# Patient Record
Sex: Male | Born: 1970 | Race: White | Hispanic: No | Marital: Single | Smoking: Current every day smoker
Health system: Southern US, Community
[De-identification: ages and names within clinical notes are randomized; demographics above are authoritative.]

## PROBLEM LIST (undated history)

## (undated) DIAGNOSIS — I10 Essential (primary) hypertension: Secondary | ICD-10-CM

## (undated) DIAGNOSIS — E7800 Pure hypercholesterolemia, unspecified: Secondary | ICD-10-CM

## (undated) DIAGNOSIS — E119 Type 2 diabetes mellitus without complications: Secondary | ICD-10-CM

## (undated) HISTORY — PX: OTHER SURGICAL HISTORY: SHX169

## (undated) HISTORY — PX: TENDON REPAIR: SHX5111

---

## 2013-10-22 ENCOUNTER — Emergency Department (HOSPITAL_COMMUNITY)
Admission: EM | Admit: 2013-10-22 | Discharge: 2013-10-22 | Disposition: A | Discharge status: Home / Self Care | Payer: Self-pay | Attending: Emergency Medicine | Admitting: Emergency Medicine

## 2013-10-22 ENCOUNTER — Encounter (HOSPITAL_COMMUNITY): Payer: Self-pay | Admitting: Emergency Medicine

## 2013-10-22 DIAGNOSIS — J039 Acute tonsillitis, unspecified: Secondary | ICD-10-CM | POA: Insufficient documentation

## 2013-10-22 DIAGNOSIS — F17200 Nicotine dependence, unspecified, uncomplicated: Secondary | ICD-10-CM | POA: Insufficient documentation

## 2013-10-22 DIAGNOSIS — J069 Acute upper respiratory infection, unspecified: Secondary | ICD-10-CM | POA: Insufficient documentation

## 2013-10-22 LAB — RAPID STREP SCREEN (MED CTR MEBANE ONLY): STREPTOCOCCUS, GROUP A SCREEN (DIRECT): NEGATIVE

## 2013-10-22 MED ORDER — AMOXICILLIN 500 MG PO CAPS: 500.0000 mg | ORAL_CAPSULE | Freq: Three times a day (TID) | ORAL | Status: DC

## 2013-10-22 MED ORDER — OXYMETAZOLINE HCL 0.05 % NA SOLN
1.0000 | Freq: Once | NASAL | Status: AC
  Administered 2013-10-22: 1 via NASAL
  Filled 2013-10-22: qty 15

## 2013-10-22 MED ORDER — DEXAMETHASONE SODIUM PHOSPHATE 10 MG/ML IJ SOLN
10.0000 mg | Freq: Once | INTRAMUSCULAR | Status: AC
  Administered 2013-10-22: 10 mg via INTRAMUSCULAR
  Filled 2013-10-22: qty 1

## 2013-10-22 MED ORDER — HYDROCODONE-ACETAMINOPHEN 7.5-325 MG/15ML PO SOLN: 15.0000 mL | Freq: Three times a day (TID) | ORAL | Status: DC | PRN

## 2013-10-22 MED ORDER — HYDROCODONE-ACETAMINOPHEN 7.5-325 MG/15ML PO SOLN
10.0000 mL | Freq: Once | ORAL | Status: AC
  Administered 2013-10-22: 10 mL via ORAL
  Filled 2013-10-22: qty 15

## 2013-10-22 NOTE — Discharge Instructions (Signed)
Ibuprofen or tylenol every 8 hours, hydrocodone suspension for severe pain  You strep test is negative - tonsillitis is often caused by Strep bacteria but may be caused by a virus.  Amoxicillin for sore throat may help.  We have given you a one time dose of a steroid to help with swelling in your throat.  Use the nasal spray NO MORE than one spray in each nostril every 12 hours.  If you develop severe swelling, difficulty breathing / swallowing or high fevers, return to the ER immediately.  [facility] Primary Care Doctor List    Sinda Du MD. Specialty: Pulmonary Disease Contact information: [place]   [place] [address]  [phone]   Tula Nakayama, MD. Specialty: [facility] Contact information: [address], [address] [place] [address]  [phone]   Sallee Lange, MD. Specialty: [facility] Contact information: [place]  [place] [address]  [phone]   Rosita Fire, MD Specialty: Internal Medicine Contact information: [place] [address]  [phone]   Delphina Cahill, MD. Specialty: Internal Medicine Contact information: [place] [address]  [phone]   Marjean Donna, MD. Specialty: Family Medicine Contact information: [place] [address]  [phone]   Leslie Andrea, MD. Specialty: [facility] Contact information: [place] [address]  [phone]   Asencion Noble, MD. Specialty: Internal Medicine Contact information: [place] [address]  [phone]

## 2013-10-22 NOTE — ED Notes (Signed)
Please disregard discharge and care handoff note of 06:09 and 06:10.

## 2013-10-22 NOTE — ED Notes (Signed)
Patient c/o cough, nasal congestion and shortness of breath since Sunday.

## 2013-10-22 NOTE — ED Provider Notes (Signed)
CSN: [id number]     Arrival date & time 10/22/13  0502 History   First MD Initiated Contact with Patient 10/22/13 [phone]     Chief Complaint  Patient presents with  . Shortness of Breath     (Consider location/radiation/quality/duration/timing/severity/associated sxs/prior Treatment) HPI Comments: 43 year old male with no past medical history who presents with a complaint of nasal congestion, sore throat, subjective fevers and headache. He has had several other family members that became sick at the same time however it is taking him longer to get better and he feels as though this is getting worse every day. He is unable to lay in a supine position secondary to his nasal congestion and inability to breathe out of his mouth. He feels as though his tonsils are swollen. He is not coughing and has no swelling of his lower extremities. Nothing seems to make this better or worse, no medications prior to arrival.  Patient is a 43 y.o. male presenting with shortness of breath. The history is provided by the patient and a relative.  Shortness of Breath   History reviewed. No pertinent past medical history. History reviewed. No pertinent past surgical history. No family history on file. History  Substance Use Topics  . Smoking status: Current Some Day Smoker  . Smokeless tobacco: Not on file  . Alcohol Use: Yes    Review of Systems  Respiratory: Positive for shortness of breath.   All other systems reviewed and are negative.     Allergies  Review of patient's allergies indicates no known allergies.  Home Medications   Prior to Admission medications   Not on File   BP 141/85  Pulse 97  Temp(Src) 98.7 F (37.1 C) (Oral)  Resp 22  Ht 6\' 1"  (1.854 m)  Wt 315 lb (142.883 kg)  BMI 41.57 kg/m2  SpO2 97% Physical Exam  Nursing note and vitals reviewed. Constitutional: He appears well-developed and well-nourished. No distress.  HENT:  Head: Normocephalic and atraumatic.   Mouth/Throat: No oropharyngeal exudate.  Bilateral enlarged tonsils, hypertrophy present, exudate present, uvula is midline, mucous membranes are erythematous  Eyes: Conjunctivae and EOM are normal. Pupils are equal, round, and reactive to light. Right eye exhibits no discharge. Left eye exhibits no discharge. No scleral icterus.  Neck: Normal range of motion. Neck supple. No JVD present. No thyromegaly present.  Cardiovascular: Normal rate, regular rhythm, normal heart sounds and intact distal pulses.  Exam reveals no gallop and no friction rub.   No murmur heard. Pulmonary/Chest: Effort normal and breath sounds normal. No respiratory distress. He has no wheezes. He has no rales.  Abdominal: Soft. Bowel sounds are normal. He exhibits no distension and no mass. There is no tenderness.  No obvious hepatosplenomegaly however the patient is obese and difficult to examine. There is no tenderness  Musculoskeletal: Normal range of motion. He exhibits no edema and no tenderness.  Lymphadenopathy:    He has cervical adenopathy (bilateral enlarged anterior cervical and submandibular lymphadenopathy).  Neurological: He is alert. Coordination normal.  Skin: Skin is warm and dry. No rash noted. No erythema.  Psychiatric: He has a normal mood and affect. His behavior is normal.    ED Course  Procedures (including critical care time) Labs Review Labs Reviewed  RAPID STREP SCREEN  CULTURE, GROUP A STREP    Imaging Review No results found.    MDM   Final diagnoses:  Tonsillitis  URI (upper respiratory infection)    Rapid strep test obtained, strep versus  viral illness suspected, with significant nasal congestion I suspect that this is related to a virus and considering that his family members are already improving significantly it makes it more likely a virus. The patient will be Decadron for his significant hypertrophy tonsils, hydrocodone suspension for pain, Afrin for nasal congestion. The  patient is in agreement with the plan. He does not have a fever, hypoxia or tachycardia.  Strep neg, meds given as below - pt states he is [facility] better  Instructions on f/u given and received well by pt and family.  Meds given in ED:  Medications  dexamethasone (DECADRON) injection 10 mg (10 mg Intramuscular Given 10/22/13 0623)  HYDROcodone-acetaminophen (HYCET) 7.5-325 mg/15 ml solution 10 mL (10 mLs Oral Given 10/22/13 0623)  oxymetazoline (AFRIN) 0.05 % nasal spray 1 spray (1 spray Each Nare Given 10/22/13 0625)    New Prescriptions   AMOXICILLIN (AMOXIL) 500 MG CAPSULE    Take 1 capsule (500 mg total) by mouth 3 (three) times daily.   HYDROCODONE-ACETAMINOPHEN (HYCET) 7.5-325 MG/15 ML SOLUTION    Take 15 mLs by mouth every 8 (eight) hours as needed for moderate pain.      Johnna Acosta, MD 10/22/13 [phone]

## 2013-10-24 LAB — CULTURE, GROUP A STREP

## 2013-10-26 ENCOUNTER — Emergency Department (HOSPITAL_COMMUNITY)
Admission: EM | Admit: 2013-10-26 | Discharge: 2013-10-26 | Disposition: A | Discharge status: Home / Self Care | Payer: Self-pay | Attending: Emergency Medicine | Admitting: Emergency Medicine

## 2013-10-26 ENCOUNTER — Encounter (HOSPITAL_COMMUNITY): Payer: Self-pay | Admitting: Emergency Medicine

## 2013-10-26 DIAGNOSIS — F17200 Nicotine dependence, unspecified, uncomplicated: Secondary | ICD-10-CM | POA: Insufficient documentation

## 2013-10-26 DIAGNOSIS — J029 Acute pharyngitis, unspecified: Secondary | ICD-10-CM | POA: Insufficient documentation

## 2013-10-26 DIAGNOSIS — M542 Cervicalgia: Secondary | ICD-10-CM | POA: Insufficient documentation

## 2013-10-26 DIAGNOSIS — R630 Anorexia: Secondary | ICD-10-CM | POA: Insufficient documentation

## 2013-10-26 DIAGNOSIS — Z792 Long term (current) use of antibiotics: Secondary | ICD-10-CM | POA: Insufficient documentation

## 2013-10-26 DIAGNOSIS — Z791 Long term (current) use of non-steroidal anti-inflammatories (NSAID): Secondary | ICD-10-CM | POA: Insufficient documentation

## 2013-10-26 LAB — RAPID STREP SCREEN (MED CTR MEBANE ONLY): Streptococcus, Group A Screen (Direct): NEGATIVE

## 2013-10-26 MED ORDER — DEXAMETHASONE 10 MG/ML FOR PEDIATRIC ORAL USE
10.0000 mg | Freq: Once | INTRAMUSCULAR | Status: AC
  Administered 2013-10-26: 10 mg via ORAL
  Filled 2013-10-26: qty 1

## 2013-10-26 MED ORDER — KETOROLAC TROMETHAMINE 60 MG/2ML IM SOLN
60.0000 mg | Freq: Once | INTRAMUSCULAR | Status: AC
  Administered 2013-10-26: 60 mg via INTRAMUSCULAR
  Filled 2013-10-26: qty 2

## 2013-10-26 MED ORDER — NAPROXEN 375 MG PO TABS: 375.0000 mg | ORAL_TABLET | Freq: Two times a day (BID) | ORAL | Status: DC

## 2013-10-26 NOTE — ED Provider Notes (Signed)
CSN: [id number]     Arrival date & time 10/26/13  0418 History   First MD Initiated Contact with Patient 10/26/13 [phone]     Chief Complaint  Patient presents with  . Sore Throat     (Consider location/radiation/quality/duration/timing/severity/associated sxs/prior Treatment) HPI Comments:  43 year old male With smoking and alcohol history presents with recurrent sore throat. Patient was seen in the past week in the ER with negative strep test and supportive care given including narcotics and steroid shot. Patient has improved since and swelling is improved however he ran out of his liquid narcotic. No breathing difficulties or fevers. No other symptoms except congestion and cough.  Patient is a 43 y.o. male presenting with pharyngitis. The history is provided by the patient.  Sore Throat Pertinent negatives include no abdominal pain, no headaches and no shortness of breath.    History reviewed. No pertinent past medical history. History reviewed. No pertinent past surgical history. History reviewed. No pertinent family history. History  Substance Use Topics  . Smoking status: Current Some Day Smoker  . Smokeless tobacco: Not on file  . Alcohol Use: Yes    Review of Systems  Constitutional: Positive for appetite change. Negative for fever and chills.  HENT: Positive for congestion and sore throat.   Respiratory: Positive for cough (worse with lying flat with drainage). Negative for shortness of breath.   Gastrointestinal: Negative for vomiting and abdominal pain.  Musculoskeletal: Positive for neck pain. Negative for neck stiffness.  Skin: Negative for rash.  Neurological: Negative for light-headedness and headaches.      Allergies  Review of patient's allergies indicates no known allergies.  Home Medications   Prior to Admission medications   Medication Sig Start Date End Date Taking? Authorizing Provider  amoxicillin (AMOXIL) 500 MG capsule Take 1 capsule (500 mg total)  by mouth 3 (three) times daily. 10/22/13   Johnna Acosta, MD  HYDROcodone-acetaminophen (HYCET) 7.5-325 mg/15 ml solution Take 15 mLs by mouth every 8 (eight) hours as needed for moderate pain. 10/22/13   Johnna Acosta, MD  naproxen (NAPROSYN) 375 MG tablet Take 1 tablet (375 mg total) by mouth 2 (two) times daily. 10/26/13   Mariea Clonts, MD   BP 142/93  Pulse 87  Temp(Src) 98.1 F (36.7 C) (Oral)  Resp 17  Ht 6' (1.829 m)  Wt 315 lb (142.883 kg)  BMI 42.71 kg/m2  SpO2 99% Physical Exam  Nursing note and vitals reviewed. Constitutional: He appears well-developed and well-nourished. No distress.  HENT:  Head: Normocephalic and atraumatic.  No trismus, uvular deviation, unilateral posterior pharyngeal edema or submandibular swelling. Patient has mild uvular swelling without signs of abscess. No submandibular swelling. Patient has anterior cervical tender adenopathy.   Eyes: Conjunctivae are normal.  Neck: Normal range of motion. Neck supple.  Cardiovascular: Normal rate.   Pulmonary/Chest: Effort normal and breath sounds normal.  Lymphadenopathy:    He has cervical adenopathy.    ED Course  Procedures (including critical care time) Labs Review Labs Reviewed  RAPID STREP SCREEN  CULTURE, GROUP A STREP    Imaging Review No results found.   EKG Interpretation None      MDM   Final diagnoses:  Acute pharyngitis   Well-appearing male with pharyngitis similar to previous that has improved however not resolved. Symptoms had been going on for 6-7 days. No signs of emergent neck infection at this time. Patient is finishing amoxicillin. Repeat Decadron and Toradol given in the ER. Naproxen and  outpatient followup discussed. I do not feel patient needs a CT scan of his neck at this time. No meningismus.  Results and differential diagnosis were discussed with the patient/parent/guardian. Close follow up outpatient was discussed, comfortable with the plan.   Filed Vitals:    10/26/13 0431  BP: 142/93  Pulse: 87  Temp: 98.1 F (36.7 C)  TempSrc: Oral  Resp: 17  Height: 6' (1.829 m)  Weight: 315 lb (142.883 kg)  SpO2: 99%         Mariea Clonts, MD 10/26/13 [phone]

## 2013-10-26 NOTE — Discharge Instructions (Signed)
If you were given medicines take as directed.  If you are on coumadin or contraceptives realize their levels and effectiveness is altered by many different medicines.  If you have any reaction (rash, tongues swelling, other) to the medicines stop taking and see a physician.   Please follow up as directed and return to the ER or see a physician for new or worsening symptoms.  Thank you. Filed Vitals:   10/26/13 0431  BP: 142/93  Pulse: 87  Temp: 98.1 F (36.7 C)  TempSrc: Oral  Resp: 17  Height: 6' (1.829 m)  Weight: 315 lb (142.883 kg)  SpO2: 99%

## 2013-10-26 NOTE — ED Notes (Signed)
Pt c/o sore throat, cough, rt ear pain and sob at times. Pt was seen for the same here 5/27

## 2013-10-29 ENCOUNTER — Emergency Department (HOSPITAL_COMMUNITY)
Admission: EM | Admit: 2013-10-29 | Discharge: 2013-10-30 | Disposition: A | Discharge status: Home / Self Care | Payer: Self-pay | Attending: Emergency Medicine | Admitting: Emergency Medicine

## 2013-10-29 ENCOUNTER — Encounter (HOSPITAL_COMMUNITY): Payer: Self-pay | Admitting: Emergency Medicine

## 2013-10-29 ENCOUNTER — Emergency Department (HOSPITAL_COMMUNITY): Payer: Self-pay

## 2013-10-29 DIAGNOSIS — D72829 Elevated white blood cell count, unspecified: Secondary | ICD-10-CM | POA: Insufficient documentation

## 2013-10-29 DIAGNOSIS — H9209 Otalgia, unspecified ear: Secondary | ICD-10-CM | POA: Insufficient documentation

## 2013-10-29 DIAGNOSIS — Z791 Long term (current) use of non-steroidal anti-inflammatories (NSAID): Secondary | ICD-10-CM | POA: Insufficient documentation

## 2013-10-29 DIAGNOSIS — F17200 Nicotine dependence, unspecified, uncomplicated: Secondary | ICD-10-CM | POA: Insufficient documentation

## 2013-10-29 DIAGNOSIS — J039 Acute tonsillitis, unspecified: Secondary | ICD-10-CM | POA: Insufficient documentation

## 2013-10-29 DIAGNOSIS — Z792 Long term (current) use of antibiotics: Secondary | ICD-10-CM | POA: Insufficient documentation

## 2013-10-29 LAB — CULTURE, GROUP A STREP

## 2013-10-29 MED ORDER — MORPHINE SULFATE 4 MG/ML IJ SOLN
8.0000 mg | Freq: Once | INTRAMUSCULAR | Status: AC
  Administered 2013-10-29: 8 mg via INTRAVENOUS
  Filled 2013-10-29: qty 2

## 2013-10-29 MED ORDER — DEXAMETHASONE SODIUM PHOSPHATE 4 MG/ML IJ SOLN
12.0000 mg | Freq: Once | INTRAMUSCULAR | Status: AC
  Administered 2013-10-29: 12 mg via INTRAVENOUS
  Filled 2013-10-29: qty 3

## 2013-10-29 MED ORDER — KETOROLAC TROMETHAMINE 30 MG/ML IJ SOLN
30.0000 mg | Freq: Once | INTRAMUSCULAR | Status: AC
  Administered 2013-10-29: 30 mg via INTRAVENOUS
  Filled 2013-10-29: qty 1

## 2013-10-29 MED ORDER — SODIUM CHLORIDE 0.9 % IV BOLUS (SEPSIS)
1000.0000 mL | Freq: Once | INTRAVENOUS | Status: AC
  Administered 2013-10-29: 1000 mL via INTRAVENOUS

## 2013-10-29 NOTE — ED Notes (Signed)
Pt c/o increased sore throat and rt ear pain. Pt has been seen here twice for the same.

## 2013-10-30 LAB — CBC WITH DIFFERENTIAL/PLATELET
Basophils Absolute: 0.2 10*3/uL — ABNORMAL HIGH (ref 0.0–0.1)
Basophils Relative: 1 % (ref 0–1)
Eosinophils Absolute: 0.3 10*3/uL (ref 0.0–0.7)
Eosinophils Relative: 2 % (ref 0–5)
HCT: 45.2 % (ref 39.0–52.0)
Hemoglobin: 15.3 g/dL (ref 13.0–17.0)
Lymphocytes Relative: 20 % (ref 12–46)
Lymphs Abs: 3.4 10*3/uL (ref 0.7–4.0)
MCH: 31.6 pg (ref 26.0–34.0)
MCHC: 33.8 g/dL (ref 30.0–36.0)
MCV: 93.4 fL (ref 78.0–100.0)
Monocytes Absolute: 1.4 10*3/uL — ABNORMAL HIGH (ref 0.1–1.0)
Monocytes Relative: 8 % (ref 3–12)
Neutro Abs: 11.8 10*3/uL — ABNORMAL HIGH (ref 1.7–7.7)
Neutrophils Relative %: 69 % (ref 43–77)
Platelets: 265 10*3/uL (ref 150–400)
RBC: 4.84 MIL/uL (ref 4.22–5.81)
RDW: 14.2 % (ref 11.5–15.5)
WBC Morphology: INCREASED
WBC: 17.1 10*3/uL — ABNORMAL HIGH (ref 4.0–10.5)

## 2013-10-30 LAB — BASIC METABOLIC PANEL
BUN: 13 mg/dL (ref 6–23)
CO2: 24 mEq/L (ref 19–32)
Calcium: 8.1 mg/dL — ABNORMAL LOW (ref 8.4–10.5)
Chloride: 101 mEq/L (ref 96–112)
Creatinine, Ser: 0.81 mg/dL (ref 0.50–1.35)
GFR calc Af Amer: >90 mL/min (ref >90)
GFR calc non Af Amer: >90 mL/min (ref >90)
Glucose, Bld: 122 mg/dL — ABNORMAL HIGH (ref 70–99)
Potassium: 4.1 mEq/L (ref 3.7–5.3)
Sodium: 138 mEq/L (ref 137–147)

## 2013-10-30 MED ORDER — IOHEXOL 300 MG/ML  SOLN
75.0000 mL | Freq: Once | INTRAMUSCULAR | Status: AC | PRN
  Administered 2013-10-30: 75 mL via INTRAVENOUS

## 2013-10-30 MED ORDER — LIDOCAINE VISCOUS 2 % MT SOLN
15.0000 mL | Freq: Once | OROMUCOSAL | Status: AC
  Administered 2013-10-30: 15 mL via OROMUCOSAL
  Filled 2013-10-30: qty 15

## 2013-10-30 MED ORDER — CLINDAMYCIN PHOSPHATE 600 MG/50ML IV SOLN
600.0000 mg | Freq: Once | INTRAVENOUS | Status: AC
  Administered 2013-10-30: 600 mg via INTRAVENOUS
  Filled 2013-10-30: qty 50

## 2013-10-30 MED ORDER — HYDROCODONE-ACETAMINOPHEN 7.5-325 MG PO TABS: 1.0000 | ORAL_TABLET | ORAL | Status: DC | PRN

## 2013-10-30 MED ORDER — CLINDAMYCIN HCL 150 MG PO CAPS: 150.0000 mg | ORAL_CAPSULE | Freq: Four times a day (QID) | ORAL | Status: DC

## 2013-10-30 NOTE — Discharge Instructions (Signed)
Your tests show an elevation in your white blood cell count and indications of advanced infection. 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 There is concern that there may be other conditions going on related to your vallecula. It is extremely important that you see the ear nose and throat specialist listed above, or the ear nose and throat specialist of your choice as sone as possible. Please use clindamycin every 6 hours with food. May use Tylenol for mild pain, may use Norco for more severe pain. Please return to the emergency department if any signs of advancing infection, difficulty with swallowing or controlling secretions, or deterioration in her general condition. Tonsillitis Tonsillitis is an infection of the throat. This infection causes the tonsils to become red, tender, and puffy (swollen). Tonsils are groups of tissue at the back of your throat. If bacteria caused your infection, antibiotic medicine will be given to you. Sometimes symptoms of tonsillitis can be relieved with the use of steroid medicine. If your tonsillitis is severe and happens often, you may need to get your tonsils removed (tonsillectomy). HOME CARE   Rest and sleep often.  Drink enough fluids to keep your pee (urine) clear or pale yellow.  While your throat is sore, eat soft or liquid foods like:  Soup.  Ice cream.  Instant breakfast drinks.  Eat frozen ice pops.  Gargle with a warm or cold liquid to help soothe the throat. Gargle with a water and salt mix. Mix 1/4 teaspoon of salt and 1/4 teaspoon of baking soda in 1 cup of water.  Only take medicines as told by your doctor.  If you are given medicines (antibiotics), take them as told. Finish them even if you start to feel better. GET HELP RIGHT AWAY IF:   You throw up (vomit).  You have a very bad headache.  You have a stiff neck.  You have chest pain.  You have trouble breathing or  swallowing.  You have bad throat pain, drooling, or your voice changes.  You have bad pain not helped by medicine.  You cannot fully open your mouth.  You have redness, puffiness, or bad pain in the neck.  You have a fever.  You have a rash.  You cough up green, yellow-brown, or bloody fluid.  You cannot swallow liquids or food for 24 hours.  You notice that only one of your tonsils is swollen. MAKE SURE YOU:   Understand these instructions.  Will watch your condition.  Will get help right away if you are not doing well or get worse. Document Released: 11/01/2007 Document Revised: 01/15/2013 Document Reviewed: 11/01/2012 [facility] Patient Information 2014 [place], [place].  Leukocytosis Leukocytosis means you have more white blood cells than normal. White blood cells are made in your bone marrow. The main job of white blood cells is to fight infection. Having too many white blood cells is a common condition. It can develop as a result of many types of medical problems. CAUSES  In some cases, your bone marrow may be normal, but it is still making too many white blood cells. This could be the result of:  Infection.  Injury.  Physical stress.  Emotional stress.  Surgery.  Allergic reactions.  Tumors that do not start in the blood or bone marrow.  An inherited disease.  Certain medicines.  Pregnancy and labor. In other cases, you may have a bone marrow disorder that is causing your body to make too  many white blood cells. Bone marrow disorders include:  Leukemia. This is a type of blood cancer.  Myeloproliferative disorders. These disorders cause blood cells to grow abnormally. SYMPTOMS  Some people have no symptoms. Others have symptoms due to the medical problem that is causing their leukocytosis. These symptoms may include:  Bleeding.  Bruising.  Fever.  Night sweats.  Repeated infections.  Weakness.  Weight loss. DIAGNOSIS  Leukocytosis is  often found during blood tests that are done as part of a normal physical exam. Your caregiver will probably order other tests to help determine why you have too many white blood cells. These tests may include:  A complete blood count (CBC). This test measures all the types of blood cells in your body.  Chest X-rays, urine tests (urinalysis), or other tests to look for signs of infection.  Bone marrow aspiration. For this test, a needle is put into your bone. Cells from the bone marrow are removed through the needle. The cells are then examined under a microscope. TREATMENT  Treatment is usually not needed for leukocytosis. However, if a disorder is causing your leukocytosis, it will need to be treated. Treatment may include:  Antibiotic medicines if you have a bacterial infection.  Bone marrow transplant. Your diseased bone marrow is replaced with healthy cells that will grow new bone marrow.  Chemotherapy. This is the use of drugs to kill cancer cells. HOME CARE INSTRUCTIONS  Only take over-the-counter or prescription medicines as directed by your caregiver.  Maintain a healthy weight. Ask your caregiver what weight is best for you.  Eat foods that are low in saturated fats and high in fiber. Eat plenty of fruits and vegetables.  Drink enough fluids to keep your urine clear or pale yellow.  Get 30 minutes of exercise at least 5 times a week. Check with your caregiver before starting a new exercise routine.  Limit caffeine and alcohol.  Do not smoke.  Keep all follow-up appointments as directed by your caregiver. SEEK MEDICAL CARE IF:  You feel weak or more tired than usual.  You develop chills, a cough, or nasal congestion.  You lose weight without trying.  You have night sweats.  You bruise easily. SEEK IMMEDIATE MEDICAL CARE IF:  You bleed more than normal.  You have chest pain.  You have trouble breathing.  You have a fever.  You have uncontrolled nausea or  vomiting.  You feel dizzy or lightheaded. MAKE SURE YOU:  Understand these instructions.  Will watch your condition.  Will get help right away if you are not doing well or get worse. Document Released: 05/04/2011 Document Revised: 08/07/2011 Document Reviewed: 05/04/2011 [facility] Patient Information 2014 [place], [place].

## 2013-10-30 NOTE — ED Provider Notes (Signed)
CSN: [id number]     Arrival date & time 10/29/13  2213 History   First MD Initiated Contact with Patient 10/29/13 2311     Chief Complaint  Patient presents with  . Sore Throat     (Consider location/radiation/quality/duration/timing/severity/associated sxs/prior Treatment) HPI Comments: Patient is a 43 year old male who presents to the emergency department with complaint of increasing sore throat and right ear pain. The patient was seen in the emergency department on or about May 27 at which time he was noted to have pharyngitis. The patient was treated with amoxicillin and pain medication. The patient returned approximately 3 days later stating that he was continuing to have increasing problems with sore throat the patient had strep screen done which was found to be negative. He had a strep culture done which was also negative for beta hemolytic strep. The patient states he has tried saltwater gargles he has tried anti-inflammatory medication he has finished his antibiotic and he continues to have increasing pain and now the pain is moving into his right year. The patient denies any unusual fever or chills. He states that he can swallow liquids. He does not have any swelling that he admits to under the tongue. He has not had any nausea or vomiting related to this issue. There's been no unusual rash appreciated.  Patient is a 43 y.o. male presenting with pharyngitis. The history is provided by the patient.  Sore Throat Associated symptoms include a sore throat. Pertinent negatives include no abdominal pain, arthralgias, chest pain, coughing or neck pain.    History reviewed. No pertinent past medical history. History reviewed. No pertinent past surgical history. History reviewed. No pertinent family history. History  Substance Use Topics  . Smoking status: Current Some Day Smoker  . Smokeless tobacco: Not on file  . Alcohol Use: Yes    Review of Systems  Constitutional: Negative for  activity change.       All ROS Neg except as noted in HPI  HENT: Positive for ear pain and sore throat.   Eyes: Negative for photophobia and discharge.  Respiratory: Negative for cough, shortness of breath and wheezing.   Cardiovascular: Negative for chest pain and palpitations.  Gastrointestinal: Negative for abdominal pain and blood in stool.  Genitourinary: Negative for dysuria, frequency and hematuria.  Musculoskeletal: Negative for arthralgias, back pain and neck pain.  Skin: Negative.   Neurological: Negative for dizziness, seizures and speech difficulty.  Psychiatric/Behavioral: Negative for hallucinations and confusion.      Allergies  Review of patient's allergies indicates no known allergies.  Home Medications   Prior to Admission medications   Medication Sig Start Date End Date Taking? Authorizing Provider  amoxicillin (AMOXIL) 500 MG capsule Take 1 capsule (500 mg total) by mouth 3 (three) times daily. 10/22/13  Yes Johnna Acosta, MD  naproxen (NAPROSYN) 375 MG tablet Take 1 tablet (375 mg total) by mouth 2 (two) times daily. 10/26/13  Yes Mariea Clonts, MD   BP 127/72  Pulse 91  Temp(Src) 98.4 F (36.9 C) (Oral)  Resp 20  Ht 6\' 1"  (1.854 m)  Wt 315 lb (142.883 kg)  BMI 41.57 kg/m2  SpO2 98% Physical Exam  Nursing note and vitals reviewed. Constitutional: He is oriented to person, place, and time. He appears well-developed and well-nourished.  Non-toxic appearance.  HENT:  Head: Normocephalic.  Right Ear: Tympanic membrane and external ear normal.  Left Ear: Tympanic membrane and external ear normal.  Mouth/Throat: Uvula is midline. There is  trismus in the jaw. Uvula swelling present. Posterior oropharyngeal erythema present.  Pt speaks softly. Has pain with swallowing. Swelling of the tonsils, but not visible abscess. No swelling under the tongue.  Eyes: EOM and lids are normal. Pupils are equal, round, and reactive to light.  Neck: Neck supple. Carotid  bruit is not present.  Increase prominence of the submental area. Trachea mid line. Tender to palpation.  Cardiovascular: Normal rate, regular rhythm, normal heart sounds, intact distal pulses and normal pulses.  Exam reveals no friction rub.   No murmur heard. Pulmonary/Chest: Breath sounds normal. No respiratory distress.  Course breath sounds, question transmitted breath sounds from the upper respiratory area. Symmetrical lrise and fall of the chest.  Abdominal: Soft. Bowel sounds are normal. There is no tenderness. There is no guarding.  Musculoskeletal: Normal range of motion.  Lymphadenopathy:       Head (right side): No submandibular adenopathy present.       Head (left side): No submandibular adenopathy present.    He has no cervical adenopathy.  Neurological: He is alert and oriented to person, place, and time. He has normal strength. No cranial nerve deficit or sensory deficit.  Skin: Skin is warm and dry.  Psychiatric: He has a normal mood and affect. His speech is normal.    ED Course  Procedures (including critical care time) Labs Review Labs Reviewed  CBC WITH DIFFERENTIAL - Abnormal; Notable for the following:    WBC 17.1 (*)    Neutro Abs 11.8 (*)    Monocytes Absolute 1.4 (*)    Basophils Absolute 0.2 (*)    All other components within normal limits  BASIC METABOLIC PANEL    Imaging Review No results found.   EKG Interpretation None      MDM Patient speaking in complete sentences, but has a very raspy voice.  Patient seen with me by Dr. Wilson Singer. Plans made for blood work and CT maxillofacial scan.  Vital signs are well within normal limits. Pulse oximetry is 98% on room air. Within normal limits by my interpretation. The basic metabolic panel is well within normal limits. The complete a low blood count shows the white count to be elevated at 17,100, there is a shift to the left with greater than 20% bands. A CT scan of the soft tissue reveals diffuse  enlargement of the tonsillar tissue including the adenoids, but no abscess or retropharyngeal edema appreciated. This is felt to be consistent with tonsillitis. There is also noted however also radiation is involving the vallecula. There was question as to whether this was related to the tonsillitis, or 2 squamous cell carcinoma.  These findings have been discussed with the patient in terms which he understands. The patient is referred to ear nose and throat, Dr Melene Plan, here in the [facility] area. The patient is also given a brochure for the [facility] clinic. The patient is strongly encouraged to establish a primary care physician as he desires assistance with stopping smoking. The patient is treated with IV clindamycin. Prescription for clindamycin and Norco given also given to the patient. The patient is advised to return to the emergency department immediately if any difficulty with breathing, difficulty with swallowing, high fever, or deterioration in his general condition.    Final diagnoses:  None    **I have reviewed nursing notes, vital signs, and all appropriate lab and imaging results for this patient.Lenox Ahr, PA-C 10/30/13 [phone]

## 2013-10-30 NOTE — ED Provider Notes (Signed)
Medical screening examination/treatment/procedure(s) were performed by non-physician practitioner and as supervising physician I was immediately available for consultation/collaboration.   EKG Interpretation None       Virgel Manifold, MD 10/30/13 [phone]

## 2017-02-21 ENCOUNTER — Emergency Department (HOSPITAL_COMMUNITY): Payer: Self-pay

## 2017-02-21 ENCOUNTER — Encounter (HOSPITAL_COMMUNITY): Payer: Self-pay | Admitting: Emergency Medicine

## 2017-02-21 ENCOUNTER — Emergency Department (HOSPITAL_COMMUNITY)
Admission: EM | Admit: 2017-02-21 | Discharge: 2017-02-21 | Disposition: A | Discharge status: Home / Self Care | Payer: Self-pay | Attending: Emergency Medicine | Admitting: Emergency Medicine

## 2017-02-21 DIAGNOSIS — F17210 Nicotine dependence, cigarettes, uncomplicated: Secondary | ICD-10-CM | POA: Insufficient documentation

## 2017-02-21 DIAGNOSIS — R079 Chest pain, unspecified: Secondary | ICD-10-CM | POA: Insufficient documentation

## 2017-02-21 LAB — CBC
HCT: 46.8 % (ref 39.0–52.0)
Hemoglobin: 15.5 g/dL (ref 13.0–17.0)
MCH: 31.3 pg (ref 26.0–34.0)
MCHC: 33.1 g/dL (ref 30.0–36.0)
MCV: 94.5 fL (ref 78.0–100.0)
PLATELETS: 192 10*3/uL (ref 150–400)
RBC: 4.95 MIL/uL (ref 4.22–5.81)
RDW: 13.2 % (ref 11.5–15.5)
WBC: 8.6 10*3/uL (ref 4.0–10.5)

## 2017-02-21 LAB — BASIC METABOLIC PANEL
Anion gap: 8 (ref 5–15)
BUN: 11 mg/dL (ref 6–20)
CO2: 29 mmol/L (ref 22–32)
CREATININE: 0.89 mg/dL (ref 0.61–1.24)
Calcium: 9 mg/dL (ref 8.9–10.3)
Chloride: 100 mmol/L — ABNORMAL LOW (ref 101–111)
GFR calc Af Amer: >60 mL/min (ref >60)
GLUCOSE: 103 mg/dL — AB (ref 65–99)
POTASSIUM: 4.2 mmol/L (ref 3.5–5.1)
SODIUM: 137 mmol/L (ref 135–145)

## 2017-02-21 LAB — TROPONIN I: Troponin I: <0.03 ng/mL (ref <0.03)

## 2017-02-21 MED ORDER — HYDROCODONE-ACETAMINOPHEN 5-325 MG PO TABS: 1.0000 | ORAL_TABLET | Freq: Four times a day (QID) | ORAL | 0 refills | Status: DC | PRN

## 2017-02-21 MED ORDER — KETOROLAC TROMETHAMINE 60 MG/2ML IM SOLN
60.0000 mg | Freq: Once | INTRAMUSCULAR | Status: AC
  Administered 2017-02-21: 60 mg via INTRAMUSCULAR
  Filled 2017-02-21: qty 2

## 2017-02-21 MED ORDER — HYDROCODONE-ACETAMINOPHEN 5-325 MG PO TABS
1.0000 | ORAL_TABLET | Freq: Once | ORAL | Status: AC
  Administered 2017-02-21: 1 via ORAL
  Filled 2017-02-21: qty 1

## 2017-02-21 MED ORDER — BENZONATATE 100 MG PO CAPS
100.0000 mg | ORAL_CAPSULE | Freq: Once | ORAL | Status: AC
  Administered 2017-02-21: 100 mg via ORAL
  Filled 2017-02-21: qty 1

## 2017-02-21 MED ORDER — IBUPROFEN 800 MG PO TABS: 800.0000 mg | ORAL_TABLET | Freq: Three times a day (TID) | ORAL | 0 refills | Status: DC

## 2017-02-21 MED ORDER — BENZONATATE 100 MG PO CAPS: 100.0000 mg | ORAL_CAPSULE | Freq: Three times a day (TID) | ORAL | 0 refills | Status: DC | PRN

## 2017-02-21 NOTE — ED Triage Notes (Signed)
Pt reports he had cold like sx over 1 week ago and began having CP at that time. L side, no radiation, no N/V/, diaphoresis, SOB. States cold sx have resolved and CP is still there, worse with moving left arm up or twisting. Denies productive cough.

## 2017-02-21 NOTE — ED Provider Notes (Signed)
Emergency Department Provider Note   I have reviewed the triage vital signs and the nursing notes.   HISTORY  Chief Complaint Chest Pain   HPI Thomas Myers is a 46 y.o. male with a past medical history of smoking, obesity and hernia presents to the emergency department today secondary to chest pain. Patient states that about 3 or 4 weeks ago he had was some physical viral upper respiratory infection and about 5-7 days after that he had onset of chest pain that was retrosternal but worse with cough and worse with taking a deep breath and also seems to be worse if he lays flat but better if he standing up or perpendicular to the ground. He states that he did have a coughing episode with a viral infection where he heard something pop in this similar area worst pain is over the pain had seemed to improve before this one started.no recent fevers. No shortness of breath. No rash. No nausea or vomiting or other abdominal symptoms. No other modifying or associated symptoms   History reviewed. No pertinent past medical history.  There are no active problems to display for this patient.   History reviewed. No pertinent surgical history.  Current Outpatient Rx  . Order #: [id number] Class: Print  . Order #: [id number] Class: Print  . Order #: [id number] Class: Print    Allergies Patient has no known allergies.  History reviewed. No pertinent family history.  Social History Social History  Substance Use Topics  . Smoking status: Current Every Day Smoker    Packs/day: 1.00    Types: Cigarettes  . Smokeless tobacco: Never Used  . Alcohol use No    Review of Systems  All other systems negative except as documented in the HPI. All pertinent positives and negatives as reviewed in the HPI. ____________________________________________   PHYSICAL EXAM:  VITAL SIGNS: ED Triage Vitals [02/21/17 1138]  Enc Vitals Group     BP (!) 151/84     Pulse Rate 88     Resp (!) 22     Temp  97.7 F (36.5 C)     Temp Source Oral     SpO2 98 %     Weight (!) 325 lb (147.4 kg)     Height 6\' 1"  (1.854 m)     Head Circumference      Peak Flow      Pain Score 2     Pain Loc      Pain Edu?      Excl. in [place]?     Constitutional: Alert and oriented. Well appearing and in no acute distress. Eyes: Conjunctivae are normal. PERRL. EOMI. Head: Atraumatic. Nose: No congestion/rhinnorhea. Mouth/Throat: Mucous membranes are moist.  Oropharynx non-erythematous. Neck: No stridor.  No meningeal signs.   Cardiovascular: Normal rate, regular rhythm. Good peripheral circulation. Grossly normal heart sounds.   Respiratory: Normal respiratory effort.  No retractions. Lungs CTAB. Gastrointestinal: Soft and nontender. No distention.  Musculoskeletal: No lower extremity tenderness nor edema. No gross deformities of extremities. Neurologic:  Normal speech and language. No gross focal neurologic deficits are appreciated.  Skin:  Skin is warm, dry and intact. No rash noted.   ____________________________________________   LABS (all labs ordered are listed, but only abnormal results are displayed)  Labs Reviewed  BASIC METABOLIC PANEL - Abnormal; Notable for the following:       Result Value   Chloride 100 (*)    Glucose, Bld 103 (*)    All other components  within normal limits  CBC  TROPONIN I   ____________________________________________  EKG   EKG Interpretation  Date/Time:  Wednesday February 21 2017 11:44:07 EDT Ventricular Rate:  83 PR Interval:  194 QRS Duration: 90 QT Interval:  354 QTC Calculation: 415 R Axis:   28 Text Interpretation:  Normal sinus rhythm Nonspecific T wave abnormality Abnormal ECG No old tracing to compare Confirmed by Merrily Pew [phone]) on 02/21/2017 2:38:43 PM       ____________________________________________  RADIOLOGY  Dg Chest 2 View  Result Date: 02/21/2017 CLINICAL DATA:  Chest pain EXAM: CHEST  2 VIEW COMPARISON:  None. FINDINGS:  The lungs are clear without focal pneumonia, edema, pneumothorax or pleural effusion. Interstitial markings are diffusely coarsened with chronic features. Cardiopericardial silhouette is at upper limits of normal for size. The visualized bony structures of the thorax are intact. IMPRESSION: No active cardiopulmonary disease. Electronically Signed   By: Misty Stanley M.D.   On: 02/21/2017 12:17    ____________________________________________   PROCEDURES  Procedure(s) performed:   Procedures   ____________________________________________   INITIAL IMPRESSION / ASSESSMENT AND PLAN / ED COURSE  Pertinent labs & imaging results that were available during my care of the patient were reviewed by me and considered in my medical decision making (see chart for details).  Suspect either costochondritis versus pericarditis or other muscle strain versusmuscle skeletal problems. We'll suggest anti-inflammatories along with when necessary narcotics. Low suspicion for ACS, PE, dissection or pneumonia.   ____________________________________________  FINAL CLINICAL IMPRESSION(S) / ED DIAGNOSES  Final diagnoses:  Nonspecific chest pain     MEDICATIONS GIVEN DURING THIS VISIT:  Medications  ketorolac (TORADOL) injection 60 mg (60 mg Intramuscular Given 02/21/17 1459)  benzonatate (TESSALON) capsule 100 mg (100 mg Oral Given 02/21/17 1500)  HYDROcodone-acetaminophen (NORCO/VICODIN) 5-325 MG per tablet 1 tablet (1 tablet Oral Given 02/21/17 1500)     NEW OUTPATIENT MEDICATIONS STARTED DURING THIS VISIT:  New Prescriptions   BENZONATATE (TESSALON) 100 MG CAPSULE    Take 1 capsule (100 mg total) by mouth 3 (three) times daily as needed for cough.   HYDROCODONE-ACETAMINOPHEN (NORCO) 5-325 MG TABLET    Take 1 tablet by mouth every 6 (six) hours as needed for severe pain.   IBUPROFEN (ADVIL,MOTRIN) 800 MG TABLET    Take 1 tablet (800 mg total) by mouth 3 (three) times daily.    Note:  This  document was prepared using Dragon voice recognition software and may include unintentional dictation errors.   Kazi Montoro, Corene Cornea, MD 02/21/17 [phone]

## 2017-03-08 ENCOUNTER — Ambulatory Visit: Payer: Self-pay | Admitting: Physician Assistant

## 2017-03-08 ENCOUNTER — Encounter: Payer: Self-pay | Admitting: Physician Assistant

## 2017-03-08 VITALS — BP 140/72 | HR 80 | Temp 97.9°F | Ht 71.0 in | Wt 372.0 lb

## 2017-03-08 DIAGNOSIS — K439 Ventral hernia without obstruction or gangrene: Secondary | ICD-10-CM

## 2017-03-08 DIAGNOSIS — Z13220 Encounter for screening for lipoid disorders: Secondary | ICD-10-CM

## 2017-03-08 DIAGNOSIS — I10 Essential (primary) hypertension: Secondary | ICD-10-CM

## 2017-03-08 DIAGNOSIS — R9389 Abnormal findings on diagnostic imaging of other specified body structures: Secondary | ICD-10-CM

## 2017-03-08 DIAGNOSIS — Z6841 Body Mass Index (BMI) 40.0 and over, adult: Secondary | ICD-10-CM

## 2017-03-08 DIAGNOSIS — Z131 Encounter for screening for diabetes mellitus: Secondary | ICD-10-CM

## 2017-03-08 DIAGNOSIS — R079 Chest pain, unspecified: Secondary | ICD-10-CM

## 2017-03-08 MED ORDER — LISINOPRIL 10 MG PO TABS: 10.0000 mg | ORAL_TABLET | Freq: Every day | ORAL | 1 refills | Status: DC

## 2017-03-08 NOTE — Progress Notes (Signed)
BP 140/72 (BP Location: Left Arm, Patient Position: Sitting, Cuff Size: Large)   Pulse 80   Temp 97.9 F (36.6 C)   Ht 5\' 11"  (1.803 m)   Wt (!) 372 lb (168.7 kg)   SpO2 97%   BMI 51.88 kg/m    Subjective:    Patient ID: Thomas Myers, male    DOB: June 19, 1970, 46 y.o.   MRN: [id number]  HPI: Thomas Myers is a 46 y.o. male presenting on 03/08/2017 for New Patient (Initial Visit) (pt has not had PCP since he was in his 47s.) and Leg Pain (L leg)   HPI   Chief Complaint  Patient presents with  . New Patient (Initial Visit)    pt has not had PCP since he was in his 36s.  . Leg Pain    L leg   Pt was told to follow up with ENT back in 2015 for possible throat cancer but he did not.   Pt not having ST,  no dysphagia. No voice changes.   Pt still having some chest pains (from when he was seen in the ER on 02/21/17).  Pt believes that he had pericarditis.  Pt says that he is much improved.  No sob.   Pt says pain comes and goes as he exerts himself.  He says it always ends with a thumping.   Relevant past medical, surgical, family and social history reviewed and updated as indicated. Interim medical history since our last visit reviewed. Allergies and medications reviewed and updated.   Current Outpatient Prescriptions:  .  ibuprofen (ADVIL,MOTRIN) 800 MG tablet, Take 1 tablet (800 mg total) by mouth 3 (three) times daily. (Patient taking differently: Take 800 mg by mouth as needed for mild pain. ), Disp: 21 tablet, Rfl: 0   Review of Systems  Constitutional: Negative for appetite change, chills, diaphoresis, fatigue, fever and unexpected weight change.  HENT: Negative for congestion, dental problem, drooling, ear pain, facial swelling, hearing loss, mouth sores, sneezing, sore throat, trouble swallowing and voice change.   Eyes: Negative for pain, discharge, redness, itching and visual disturbance.  Respiratory: Negative for cough, choking, shortness of breath and wheezing.    Cardiovascular: Positive for chest pain and leg swelling. Negative for palpitations.  Gastrointestinal: Negative for abdominal pain, blood in stool, constipation, diarrhea and vomiting.  Endocrine: Negative for cold intolerance, heat intolerance and polydipsia.  Genitourinary: Negative for decreased urine volume, dysuria and hematuria.  Musculoskeletal: Positive for gait problem. Negative for arthralgias and back pain.  Skin: Negative for rash.  Allergic/Immunologic: Negative for environmental allergies.  Neurological: Negative for seizures, syncope, light-headedness and headaches.  Hematological: Negative for adenopathy.  Psychiatric/Behavioral: Negative for agitation, dysphoric mood and suicidal ideas. The patient is not nervous/anxious.     Per HPI unless specifically indicated above     Objective:    BP 140/72 (BP Location: Left Arm, Patient Position: Sitting, Cuff Size: Large)   Pulse 80   Temp 97.9 F (36.6 C)   Ht 5\' 11"  (1.803 m)   Wt (!) 372 lb (168.7 kg)   SpO2 97%   BMI 51.88 kg/m   Wt Readings from Last 3 Encounters:  03/08/17 (!) 372 lb (168.7 kg)  02/21/17 (!) 325 lb (147.4 kg)  10/29/13 (!) 315 lb (142.9 kg)    Physical Exam  Constitutional: He is oriented to person, place, and time. He appears well-developed and well-nourished.  HENT:  Head: Normocephalic and atraumatic.  Mouth/Throat: Oropharynx is clear  and moist. No oropharyngeal exudate.  Eyes: Pupils are equal, round, and reactive to light. Conjunctivae and EOM are normal.  Neck: Neck supple. No thyromegaly present.  Cardiovascular: Normal rate and regular rhythm.   Pulmonary/Chest: Effort normal and breath sounds normal. He has no wheezes. He has no rales.  Abdominal: Soft. Bowel sounds are normal. He exhibits no mass. There is no hepatosplenomegaly. There is no tenderness. A hernia (large, not incarcerated) is present. Hernia confirmed positive in the ventral area.  Musculoskeletal: He exhibits edema  (LLE).  Lymphadenopathy:    He has no cervical adenopathy.  Neurological: He is alert and oriented to person, place, and time.  Skin: Skin is warm and dry. No rash noted.  Psychiatric: He has a normal mood and affect. His behavior is normal. Thought content normal.  Vitals reviewed.          Assessment & Plan:   Encounter Diagnoses  Name Primary?  . Chest pain, unspecified type Yes  . Essential hypertension   . Morbid obesity ([place])   . BMI 50.0-59.9, adult ([place])   . Screening for diabetes mellitus   . Screening cholesterol level   . Abnormal CT scan, neck   . Hernia of abdominal wall     -will refer to ENT for visualization of throat- (abnorma CT ? Squamous cell CA) although unlikely to be cancer in light of no symptoms developed since that time  -pt to get Fasting labs drawn  -will order echo to evaluate chest pain -pt counseled on smoking cessation -will start Lisinopril for htn -pt was given [facility] discount application -pt to follow up 1 month.  RTO sooner prn worsening or new symptoms

## 2017-03-11 DIAGNOSIS — I10 Essential (primary) hypertension: Secondary | ICD-10-CM | POA: Insufficient documentation

## 2017-03-12 ENCOUNTER — Encounter: Payer: Self-pay | Admitting: Physician Assistant

## 2017-03-12 ENCOUNTER — Ambulatory Visit (HOSPITAL_COMMUNITY)
Admission: RE | Admit: 2017-03-12 | Discharge: 2017-03-12 | Disposition: A | Discharge status: Home / Self Care | Payer: Self-pay | Source: Ambulatory Visit | Attending: Physician Assistant | Admitting: Physician Assistant

## 2017-03-12 ENCOUNTER — Ambulatory Visit: Payer: Self-pay | Admitting: Physician Assistant

## 2017-03-12 ENCOUNTER — Other Ambulatory Visit (HOSPITAL_COMMUNITY)
Admission: RE | Admit: 2017-03-12 | Discharge: 2017-03-12 | Disposition: A | Discharge status: Home / Self Care | Payer: Self-pay | Source: Ambulatory Visit | Attending: Physician Assistant | Admitting: Physician Assistant

## 2017-03-12 VITALS — BP 134/74 | HR 77 | Temp 97.9°F | Ht 71.0 in | Wt 375.0 lb

## 2017-03-12 DIAGNOSIS — E119 Type 2 diabetes mellitus without complications: Secondary | ICD-10-CM | POA: Insufficient documentation

## 2017-03-12 DIAGNOSIS — Z6841 Body Mass Index (BMI) 40.0 and over, adult: Secondary | ICD-10-CM

## 2017-03-12 DIAGNOSIS — I10 Essential (primary) hypertension: Secondary | ICD-10-CM

## 2017-03-12 DIAGNOSIS — M7989 Other specified soft tissue disorders: Secondary | ICD-10-CM

## 2017-03-12 DIAGNOSIS — E785 Hyperlipidemia, unspecified: Secondary | ICD-10-CM | POA: Insufficient documentation

## 2017-03-12 DIAGNOSIS — Z13220 Encounter for screening for lipoid disorders: Secondary | ICD-10-CM

## 2017-03-12 DIAGNOSIS — Z131 Encounter for screening for diabetes mellitus: Secondary | ICD-10-CM

## 2017-03-12 LAB — COMPREHENSIVE METABOLIC PANEL
ALK PHOS: 92 U/L (ref 38–126)
ALT: 28 U/L (ref 17–63)
ANION GAP: 6 (ref 5–15)
AST: 17 U/L (ref 15–41)
Albumin: 3.4 g/dL — ABNORMAL LOW (ref 3.5–5.0)
BILIRUBIN TOTAL: 0.5 mg/dL (ref 0.3–1.2)
BUN: 11 mg/dL (ref 6–20)
CALCIUM: 8.7 mg/dL — AB (ref 8.9–10.3)
CO2: 27 mmol/L (ref 22–32)
Chloride: 104 mmol/L (ref 101–111)
Creatinine, Ser: 0.93 mg/dL (ref 0.61–1.24)
GLUCOSE: 97 mg/dL (ref 65–99)
Potassium: 4.3 mmol/L (ref 3.5–5.1)
Sodium: 137 mmol/L (ref 135–145)
TOTAL PROTEIN: 6.8 g/dL (ref 6.5–8.1)

## 2017-03-12 LAB — HEMOGLOBIN A1C
HEMOGLOBIN A1C: 6.5 % — AB (ref 4.8–5.6)
Mean Plasma Glucose: 139.85 mg/dL

## 2017-03-12 LAB — LIPID PANEL
Cholesterol: 187 mg/dL (ref 0–200)
HDL: 35 mg/dL — ABNORMAL LOW (ref >40)
LDL Cholesterol: 134 mg/dL — ABNORMAL HIGH (ref 0–99)
Total CHOL/HDL Ratio: 5.3 RATIO
Triglycerides: 89 mg/dL (ref <150)
VLDL: 18 mg/dL (ref 0–40)

## 2017-03-12 MED ORDER — METFORMIN HCL ER 500 MG PO TB24: 500.0000 mg | ORAL_TABLET | Freq: Every day | ORAL | 2 refills | Status: DC

## 2017-03-12 MED ORDER — SIMVASTATIN 20 MG PO TABS: 20.0000 mg | ORAL_TABLET | Freq: Every day | ORAL | 3 refills | Status: DC

## 2017-03-12 NOTE — Progress Notes (Signed)
BP 134/74 (BP Location: Left Arm, Patient Position: Sitting, Cuff Size: Large)   Pulse 77   Temp 97.9 F (36.6 C) (Other (Comment))   Ht 5\' 11"  (1.803 m)   Wt (!) 375 lb (170.1 kg)   SpO2 96%   BMI 52.30 kg/m    Subjective:    Patient ID: Thomas Myers, male    DOB: 1971-02-12, 46 y.o.   MRN: [id number]  HPI: Thomas Myers is a 46 y.o. male presenting on 03/12/2017 for Leg Swelling (left leg swollen and painful from ankle to knee)   HPI   Pt says he came back in early because the pain in his leg is worse.  No SOB  Relevant past medical, surgical, family and social history reviewed and updated as indicated. Interim medical history since our last visit reviewed. Allergies and medications reviewed and updated.   Current Outpatient Prescriptions:  .  lisinopril (PRINIVIL,ZESTRIL) 10 MG tablet, Take 1 tablet (10 mg total) by mouth daily., Disp: 30 tablet, Rfl: 1 .  ibuprofen (ADVIL,MOTRIN) 800 MG tablet, Take 1 tablet (800 mg total) by mouth 3 (three) times daily. (Patient not taking: Reported on 03/12/2017), Disp: 21 tablet, Rfl: 0   Review of Systems  Constitutional: Negative for appetite change, chills, diaphoresis, fatigue, fever and unexpected weight change.  HENT: Negative for congestion, dental problem, drooling, ear pain, facial swelling, hearing loss, mouth sores, sneezing, sore throat, trouble swallowing and voice change.   Eyes: Negative for pain, discharge, redness, itching and visual disturbance.  Respiratory: Negative for cough, choking, shortness of breath and wheezing.   Cardiovascular: Positive for leg swelling. Negative for chest pain and palpitations.  Gastrointestinal: Negative for abdominal pain, blood in stool, constipation, diarrhea and vomiting.  Endocrine: Negative for cold intolerance, heat intolerance and polydipsia.  Genitourinary: Negative for decreased urine volume, dysuria and hematuria.  Musculoskeletal: Positive for gait problem. Negative  for arthralgias and back pain.  Skin: Negative for rash.  Allergic/Immunologic: Negative for environmental allergies.  Neurological: Negative for seizures, syncope, light-headedness and headaches.  Hematological: Negative for adenopathy.  Psychiatric/Behavioral: Negative for agitation, dysphoric mood and suicidal ideas. The patient is not nervous/anxious.     Per HPI unless specifically indicated above     Objective:    BP 134/74 (BP Location: Left Arm, Patient Position: Sitting, Cuff Size: Large)   Pulse 77   Temp 97.9 F (36.6 C) (Other (Comment))   Ht 5\' 11"  (1.803 m)   Wt (!) 375 lb (170.1 kg)   SpO2 96%   BMI 52.30 kg/m   Wt Readings from Last 3 Encounters:  03/12/17 (!) 375 lb (170.1 kg)  03/08/17 (!) 372 lb (168.7 kg)  02/21/17 (!) 325 lb (147.4 kg)    Physical Exam  Constitutional: He is oriented to person, place, and time. He appears well-developed and well-nourished.  HENT:  Head: Normocephalic and atraumatic.  Neck: Neck supple.  Cardiovascular: Normal rate and regular rhythm.   Pulses:      Dorsalis pedis pulses are 2+ on the right side, and 2+ on the left side.  Pulmonary/Chest: Effort normal and breath sounds normal. He has no wheezes.  Abdominal: Soft. Bowel sounds are normal. There is no hepatosplenomegaly. There is no tenderness.  Musculoskeletal: He exhibits edema (LLE).  Lymphadenopathy:    He has no cervical adenopathy.  Neurological: He is alert and oriented to person, place, and time.  Skin: Skin is warm and dry.  Psychiatric: He has a normal mood and affect.  His behavior is normal.  Vitals reviewed.    LLE 23 in diameter,  RLE 22 in  Results for orders placed or performed during the hospital encounter of 03/12/17  Comprehensive metabolic panel  Result Value Ref Range   Sodium 137 135 - 145 mmol/L   Potassium 4.3 3.5 - 5.1 mmol/L   Chloride 104 101 - 111 mmol/L   CO2 27 22 - 32 mmol/L   Glucose, Bld 97 65 - 99 mg/dL   BUN 11 6 - 20 mg/dL    Creatinine, Ser 0.93 0.61 - 1.24 mg/dL   Calcium 8.7 (L) 8.9 - 10.3 mg/dL   Total Protein 6.8 6.5 - 8.1 g/dL   Albumin 3.4 (L) 3.5 - 5.0 g/dL   AST 17 15 - 41 U/L   ALT 28 17 - 63 U/L   Alkaline Phosphatase 92 38 - 126 U/L   Total Bilirubin 0.5 0.3 - 1.2 mg/dL   GFR calc non Af Amer >60 >60 mL/min   GFR calc Af Amer >60 >60 mL/min   Anion gap 6 5 - 15  Lipid panel  Result Value Ref Range   Cholesterol 187 0 - 200 mg/dL   Triglycerides 89 <150 mg/dL   HDL 35 (L) >40 mg/dL   Total CHOL/HDL Ratio 5.3 RATIO   VLDL 18 0 - 40 mg/dL   LDL Cholesterol 134 (H) 0 - 99 mg/dL  HgB A1c  Result Value Ref Range   Hgb A1c MFr Bld 6.5 (H) 4.8 - 5.6 %   Mean Plasma Glucose 139.85 mg/dL      Assessment & Plan:   Encounter Diagnoses  Name Primary?  . Leg swelling Yes  . Essential hypertension   . Morbid obesity ([place])   . BMI 50.0-59.9, adult ([place])   . Diabetes mellitus without complication ([place])   . Hyperlipidemia, unspecified hyperlipidemia type     -reviewed labs with pt -rxmetformin er 500 qd. Pt counseled on diabetic diet and was given handout -pt to attend Diabetes education class.   -rx Simvastatin 20 and lowfat diet for lipids -pt to Continue lisinopril for blood pressure -Doppler US today to r/o dvt -echo has already been ordered but not yet scheduled -pt to follow up in 3 weeks as scheduled

## 2017-03-12 NOTE — Patient Instructions (Addendum)
Diabetes Mellitus and Food It is important for you to manage your blood sugar (glucose) level. Your blood glucose level can be greatly affected by what you eat. Eating healthier foods in the appropriate amounts throughout the day at about the same time each day will help you control your blood glucose level. It can also help slow or prevent worsening of your diabetes mellitus. Healthy eating may even help you improve the level of your blood pressure and reach or maintain a healthy weight. General recommendations for healthful eating and cooking habits include:  Eating meals and snacks regularly. Avoid going long periods of time without eating to lose weight.  Eating a diet that consists mainly of plant-based foods, such as fruits, vegetables, nuts, legumes, and whole grains.  Using low-heat cooking methods, such as baking, instead of high-heat cooking methods, such as deep frying.  Work with your dietitian to make sure you understand how to use the Nutrition Facts information on food labels. How can food affect me? Carbohydrates Carbohydrates affect your blood glucose level more than any other type of food. Your dietitian will help you determine how many carbohydrates to eat at each meal and teach you how to count carbohydrates. Counting carbohydrates is important to keep your blood glucose at a healthy level, especially if you are using insulin or taking certain medicines for diabetes mellitus. Alcohol Alcohol can cause sudden decreases in blood glucose (hypoglycemia), especially if you use insulin or take certain medicines for diabetes mellitus. Hypoglycemia can be a life-threatening condition. Symptoms of hypoglycemia (sleepiness, dizziness, and disorientation) are similar to symptoms of having too much alcohol. If your health care provider has given you approval to drink alcohol, do so in moderation and use the following guidelines:  Women should not have more than one drink per day, and men  should not have more than two drinks per day. One drink is equal to: ? 12 oz of beer. ? 5 oz of wine. ? 1 oz of hard liquor.  Do not drink on an empty stomach.  Keep yourself hydrated. Have water, diet soda, or unsweetened iced tea.  Regular soda, juice, and other mixers might contain a lot of carbohydrates and should be counted.  What foods are not recommended? As you make food choices, it is important to remember that all foods are not the same. Some foods have fewer nutrients per serving than other foods, even though they might have the same number of calories or carbohydrates. It is difficult to get your body what it needs when you eat foods with fewer nutrients. Examples of foods that you should avoid that are high in calories and carbohydrates but low in nutrients include:  Trans fats (most processed foods list trans fats on the Nutrition Facts label).  Regular soda.  Juice.  Candy.  Sweets, such as cake, pie, doughnuts, and cookies.  Fried foods.  What foods can I eat? Eat nutrient-rich foods, which will nourish your body and keep you healthy. The food you should eat also will depend on several factors, including:  The calories you need.  The medicines you take.  Your weight.  Your blood glucose level.  Your blood pressure level.  Your cholesterol level.  You should eat a variety of foods, including:  Protein. ? Lean cuts of meat. ? Proteins low in saturated fats, such as fish, egg whites, and beans. Avoid processed meats.  Fruits and vegetables. ? Fruits and vegetables that may help control blood glucose levels, such as apples,   mangoes, and yams.  Dairy products. ? Choose fat-free or low-fat dairy products, such as milk, yogurt, and cheese.  Grains, bread, pasta, and rice. ? Choose whole grain products, such as multigrain bread, whole oats, and brown rice. These foods may help control blood pressure.  Fats. ? Foods containing healthful fats, such as  nuts, avocado, olive oil, canola oil, and fish.  Does everyone with diabetes mellitus have the same meal plan? Because every person with diabetes mellitus is different, there is not one meal plan that works for everyone. It is very important that you meet with a dietitian who will help you create a meal plan that is just right for you. This information is not intended to replace advice given to you by your health care provider. Make sure you discuss any questions you have with your health care provider. Document Released: 02/09/2005 Document Revised: 10/21/2015 Document Reviewed: 04/11/2013 [employer] Interactive Patient Education  2017 [employer]      Fat and Cholesterol Restricted Diet High levels of fat and cholesterol in your blood may lead to various health problems, such as diseases of the heart, blood vessels, gallbladder, liver, and pancreas. Fats are concentrated sources of energy that come in various forms. Certain types of fat, including saturated fat, may be harmful in excess. Cholesterol is a substance needed by your body in small amounts. Your body makes all the cholesterol it needs. Excess cholesterol comes from the food you eat. When you have high levels of cholesterol and saturated fat in your blood, health problems can develop because the excess fat and cholesterol will gather along the walls of your blood vessels, causing them to narrow. Choosing the right foods will help you control your intake of fat and cholesterol. This will help keep the levels of these substances in your blood within normal limits and reduce your risk of disease. What is my plan? Your health care provider recommends that you:  Limit your fat intake to ______% or less of your total calories per day.  Limit the amount of cholesterol in your diet to less than _________mg per day.  Eat 20-30 grams of fiber each day.  What types of fat should I choose?  Choose healthy fats more often. Choose  monounsaturated and polyunsaturated fats, such as olive and canola oil, flaxseeds, walnuts, almonds, and seeds.  Eat more omega-3 fats. Good choices include salmon, mackerel, sardines, tuna, flaxseed oil, and ground flaxseeds. Aim to eat fish at least two times a week.  Limit saturated fats. Saturated fats are primarily found in animal products, such as meats, butter, and cream. Plant sources of saturated fats include palm oil, palm kernel oil, and coconut oil.  Avoid foods with partially hydrogenated oils in them. These contain trans fats. Examples of foods that contain trans fats are stick margarine, some tub margarines, cookies, crackers, and other baked goods. What general guidelines do I need to follow? These guidelines for healthy eating will help you control your intake of fat and cholesterol:  Check food labels carefully to identify foods with trans fats or high amounts of saturated fat.  Fill one half of your plate with vegetables and green salads.  Fill one fourth of your plate with whole grains. Look for the word "whole" as the first word in the ingredient list.  Fill one fourth of your plate with lean protein foods.  Limit fruit to two servings a day. Choose fruit instead of juice.  Eat more foods that contain fiber, such as  apples, broccoli, carrots, beans, peas, and barley.  Eat more home-cooked food and less restaurant, buffet, and fast food.  Limit or avoid alcohol.  Limit foods high in starch and sugar.  Limit fried foods.  Cook foods using methods other than frying. Baking, boiling, grilling, and broiling are all great options.  Lose weight if you are overweight. Losing just 5-10% of your initial body weight can help your overall health and prevent diseases such as diabetes and heart disease.  What foods can I eat? Grains  Whole grains, such as whole wheat or whole grain breads, crackers, cereals, and pasta. Unsweetened oatmeal, bulgur, barley, quinoa, or brown  rice. Corn or whole wheat flour tortillas. Vegetables  Fresh or frozen vegetables (raw, steamed, roasted, or grilled). Green salads. Fruits  All fresh, canned (in natural juice), or frozen fruits. Meats and other protein foods  Ground beef (85% or leaner), grass-fed beef, or beef trimmed of fat. Skinless chicken or [place]. Ground chicken or [place]. Pork trimmed of fat. All fish and seafood. Eggs. Dried beans, peas, or lentils. Unsalted nuts or seeds. Unsalted canned or dry beans. Dairy  Low-fat dairy products, such as skim or 1% milk, 2% or reduced-fat cheeses, low-fat ricotta or cottage cheese, or plain low-fat yo Fats and oils  Tub margarines without trans fats. Light or reduced-fat mayonnaise and salad dressings. Avocado. Olive, canola, sesame, or safflower oils. Natural peanut or almond butter (choose ones without added sugar and oil). The items listed above may not be a complete list of recommended foods or beverages. Contact your dietitian for more options. Foods to avoid Grains  White bread. White pasta. White rice. Cornbread. Bagels, pastries, and croissants. Crackers that contain trans fat. Vegetables  White potatoes. Corn. Creamed or fried vegetables. Vegetables in a cheese sauce. Fruits  Dried fruits. Canned fruit in light or heavy syrup. Fruit juice. Meats and other protein foods  Fatty cuts of meat. Ribs, chicken wings, bacon, sausage, bologna, salami, chitterlings, fatback, hot dogs, bratwurst, and packaged luncheon meats. Liver and organ meats. Dairy  Whole or 2% milk, cream, half-and-half, and cream cheese. Whole milk cheeses. Whole-fat or sweetened yogurt. Full-fat cheeses. Nondairy creamers and whipped toppings. Processed cheese, cheese spreads, or cheese curds. Beverages  Alcohol. Sweetened drinks (such as sodas, lemonade, and fruit drinks or punches). Fats and oils  Butter, stick margarine, lard, shortening, ghee, or bacon fat. Coconut, palm kernel, or palm  oils. Sweets and desserts  Corn syrup, sugars, honey, and molasses. Candy. Jam and jelly. Syrup. Sweetened cereals. Cookies, pies, cakes, donuts, muffins, and ice cream. The items listed above may not be a complete list of foods and beverages to avoid. Contact your dietitian for more information. This information is not intended to replace advice given to you by your health care provider. Make sure you discuss any questions you have with your health care provider. Document Released: 05/15/2005 Document Revised: 06/05/2014 Document Reviewed: 08/13/2013 [employer] Interactive Patient Education  2017 [employer].

## 2017-03-17 ENCOUNTER — Emergency Department (HOSPITAL_COMMUNITY)
Admission: EM | Admit: 2017-03-17 | Discharge: 2017-03-17 | Disposition: A | Discharge status: Home Health | Payer: Self-pay | Attending: Emergency Medicine | Admitting: Emergency Medicine

## 2017-03-17 ENCOUNTER — Encounter (HOSPITAL_COMMUNITY): Payer: Self-pay | Admitting: Emergency Medicine

## 2017-03-17 ENCOUNTER — Emergency Department (HOSPITAL_COMMUNITY): Payer: Self-pay

## 2017-03-17 DIAGNOSIS — Y929 Unspecified place or not applicable: Secondary | ICD-10-CM | POA: Insufficient documentation

## 2017-03-17 DIAGNOSIS — Y939 Activity, unspecified: Secondary | ICD-10-CM | POA: Insufficient documentation

## 2017-03-17 DIAGNOSIS — Z7984 Long term (current) use of oral hypoglycemic drugs: Secondary | ICD-10-CM | POA: Insufficient documentation

## 2017-03-17 DIAGNOSIS — X58XXXA Exposure to other specified factors, initial encounter: Secondary | ICD-10-CM | POA: Insufficient documentation

## 2017-03-17 DIAGNOSIS — R1013 Epigastric pain: Secondary | ICD-10-CM | POA: Insufficient documentation

## 2017-03-17 DIAGNOSIS — Y999 Unspecified external cause status: Secondary | ICD-10-CM | POA: Insufficient documentation

## 2017-03-17 DIAGNOSIS — F17210 Nicotine dependence, cigarettes, uncomplicated: Secondary | ICD-10-CM | POA: Insufficient documentation

## 2017-03-17 DIAGNOSIS — S2242XA Multiple fractures of ribs, left side, initial encounter for closed fracture: Secondary | ICD-10-CM | POA: Insufficient documentation

## 2017-03-17 DIAGNOSIS — E119 Type 2 diabetes mellitus without complications: Secondary | ICD-10-CM | POA: Insufficient documentation

## 2017-03-17 DIAGNOSIS — I10 Essential (primary) hypertension: Secondary | ICD-10-CM | POA: Insufficient documentation

## 2017-03-17 DIAGNOSIS — R1010 Upper abdominal pain, unspecified: Secondary | ICD-10-CM

## 2017-03-17 DIAGNOSIS — E7800 Pure hypercholesterolemia, unspecified: Secondary | ICD-10-CM

## 2017-03-17 DIAGNOSIS — S2242XS Multiple fractures of ribs, left side, sequela: Secondary | ICD-10-CM

## 2017-03-17 HISTORY — DX: Pure hypercholesterolemia, unspecified: E78.00

## 2017-03-17 HISTORY — DX: Type 2 diabetes mellitus without complications: E11.9

## 2017-03-17 HISTORY — DX: Essential (primary) hypertension: I10

## 2017-03-17 LAB — COMPREHENSIVE METABOLIC PANEL
ALT: 22 U/L (ref 17–63)
AST: 13 U/L — AB (ref 15–41)
Albumin: 3.2 g/dL — ABNORMAL LOW (ref 3.5–5.0)
Alkaline Phosphatase: 80 U/L (ref 38–126)
Anion gap: 8 (ref 5–15)
BILIRUBIN TOTAL: 0.4 mg/dL (ref 0.3–1.2)
BUN: 13 mg/dL (ref 6–20)
CHLORIDE: 104 mmol/L (ref 101–111)
CO2: 27 mmol/L (ref 22–32)
CREATININE: 0.82 mg/dL (ref 0.61–1.24)
Calcium: 8.4 mg/dL — ABNORMAL LOW (ref 8.9–10.3)
GFR calc Af Amer: >60 mL/min (ref >60)
GLUCOSE: 118 mg/dL — AB (ref 65–99)
Potassium: 4.2 mmol/L (ref 3.5–5.1)
Sodium: 139 mmol/L (ref 135–145)
TOTAL PROTEIN: 6.5 g/dL (ref 6.5–8.1)

## 2017-03-17 LAB — CBC WITH DIFFERENTIAL/PLATELET
BASOS ABS: 0 10*3/uL (ref 0.0–0.1)
Basophils Relative: 0 %
Eosinophils Absolute: 0.2 10*3/uL (ref 0.0–0.7)
Eosinophils Relative: 2 %
HEMATOCRIT: 44.1 % (ref 39.0–52.0)
HEMOGLOBIN: 14.6 g/dL (ref 13.0–17.0)
LYMPHS PCT: 30 %
Lymphs Abs: 2.4 10*3/uL (ref 0.7–4.0)
MCH: 31.9 pg (ref 26.0–34.0)
MCHC: 33.1 g/dL (ref 30.0–36.0)
MCV: 96.3 fL (ref 78.0–100.0)
MONO ABS: 0.5 10*3/uL (ref 0.1–1.0)
Monocytes Relative: 6 %
NEUTROS ABS: 5 10*3/uL (ref 1.7–7.7)
Neutrophils Relative %: 62 %
Platelets: 177 10*3/uL (ref 150–400)
RBC: 4.58 MIL/uL (ref 4.22–5.81)
RDW: 13.6 % (ref 11.5–15.5)
WBC: 8.1 10*3/uL (ref 4.0–10.5)

## 2017-03-17 LAB — D-DIMER, QUANTITATIVE (NOT AT ARMC): D DIMER QUANT: 0.75 ug{FEU}/mL — AB (ref 0.00–0.50)

## 2017-03-17 LAB — TROPONIN I: Troponin I: <0.03 ng/mL (ref <0.03)

## 2017-03-17 LAB — CBG MONITORING, ED: GLUCOSE-CAPILLARY: 106 mg/dL — AB (ref 65–99)

## 2017-03-17 LAB — LIPASE, BLOOD: LIPASE: 18 U/L (ref 11–51)

## 2017-03-17 MED ORDER — OMEPRAZOLE 20 MG PO CPDR: 20.0000 mg | DELAYED_RELEASE_CAPSULE | Freq: Every day | ORAL | 0 refills | Status: DC

## 2017-03-17 MED ORDER — FENTANYL CITRATE (PF) 100 MCG/2ML IJ SOLN
50.0000 ug | Freq: Once | INTRAMUSCULAR | Status: AC
  Administered 2017-03-17: 50 ug via INTRAVENOUS
  Filled 2017-03-17: qty 2

## 2017-03-17 MED ORDER — IOPAMIDOL (ISOVUE-370) INJECTION 76%
100.0000 mL | Freq: Once | INTRAVENOUS | Status: AC | PRN
  Administered 2017-03-17: 100 mL via INTRAVENOUS

## 2017-03-17 MED ORDER — GI COCKTAIL ~~LOC~~
30.0000 mL | Freq: Once | ORAL | Status: AC
  Administered 2017-03-17: 30 mL via ORAL
  Filled 2017-03-17: qty 30

## 2017-03-17 MED ORDER — HYDROCODONE-ACETAMINOPHEN 5-325 MG PO TABS: 1.0000 | ORAL_TABLET | ORAL | 0 refills | Status: DC | PRN

## 2017-03-17 MED ORDER — ONDANSETRON HCL 4 MG/2ML IJ SOLN
4.0000 mg | Freq: Once | INTRAMUSCULAR | Status: AC
  Administered 2017-03-17: 4 mg via INTRAVENOUS
  Filled 2017-03-17: qty 2

## 2017-03-17 MED ORDER — SODIUM CHLORIDE 0.9 % IV BOLUS (SEPSIS)
1000.0000 mL | Freq: Once | INTRAVENOUS | Status: AC
  Administered 2017-03-17: 1000 mL via INTRAVENOUS

## 2017-03-17 NOTE — ED Provider Notes (Signed)
[facility] EMERGENCY DEPARTMENT Provider Note   CSN: [id number] Arrival date & time: 03/17/17  0515     History   Chief Complaint Chief Complaint  Patient presents with  . Chest Pain    HPI Thomas Myers is a 46 y.o. male.  Patient with history of diabetes and hypertension presenting with chest pain and abdominal pain.  States she was seen in the ED about 1 month ago with chest pain after a viral URI and diagnosed with pericarditis.  States this never really went away completely but did improve greatly.  Over the past several days he states the pain has moved to the right side of his chest and his upper abdomen diffusely.  The pain is worse with movement and laying down.  Denies any association with eating or drinking.  No vomiting or diarrhea.  Good appetite.  No fever.  Came in tonight because the pain is worse in his upper abdomen and left lateral ribs and he could not sleep or get comfortable.  Describes pain that worsens when he turns his torso or changes positions.  Denies any history of acid reflux or ulcers.  No previous abdominal surgeries but does have known umbilical hernia that is nontender.   The history is provided by the patient.  Chest Pain   Associated symptoms include abdominal pain and shortness of breath. Pertinent negatives include no dizziness, no fever, no headaches, no nausea, no vomiting and no weakness.    Past Medical History:  Diagnosis Date  . Diabetes mellitus without complication ([place])   . Hypercholesteremia 03/17/2017  . Hypertension     Patient Active Problem List   Diagnosis Date Noted  . Diabetes mellitus without complication ([place]) 30/16/0109  . Hyperlipidemia 03/12/2017  . Essential hypertension 03/11/2017    History reviewed. No pertinent surgical history.     Home Medications    Prior to Admission medications   Medication Sig Start Date End Date Taking? Authorizing Provider  ibuprofen (ADVIL,MOTRIN) 800 MG tablet Take 1  tablet (800 mg total) by mouth 3 (three) times daily. Patient not taking: Reported on 03/12/2017 02/21/17   Mesner, Corene Cornea, MD  lisinopril (PRINIVIL,ZESTRIL) 10 MG tablet Take 1 tablet (10 mg total) by mouth daily. 03/08/17   Soyla Dryer, PA-C  metFORMIN (GLUCOPHAGE XR) 500 MG 24 hr tablet Take 1 tablet (500 mg total) by mouth daily with breakfast. 03/12/17   Soyla Dryer, PA-C  simvastatin (ZOCOR) 20 MG tablet Take 1 tablet (20 mg total) by mouth at bedtime. 03/12/17   Soyla Dryer, PA-C    Family History Family History  Problem Relation Age of Onset  . Cancer Father        Melanoma  . Hypertension Father   . Cancer Maternal Grandmother        Lung  . Cancer Maternal Grandfather   . Diabetes Maternal Grandfather   . Cancer Paternal Grandmother   . Cancer Paternal Grandfather     Social History Social History  Substance Use Topics  . Smoking status: Current Every Day Smoker    Packs/day: 0.75    Years: 23.00    Types: Cigarettes  . Smokeless tobacco: Never Used     Comment: is using vapes to slow down cigarettes  . Alcohol use No     Allergies   Patient has no known allergies.   Review of Systems Review of Systems  Constitutional: Negative for activity change, appetite change and fever.  HENT: Negative for congestion and rhinorrhea.  Respiratory: Positive for chest tightness and shortness of breath.   Cardiovascular: Positive for chest pain.  Gastrointestinal: Positive for abdominal pain. Negative for constipation, diarrhea, nausea and vomiting.  Genitourinary: Negative for dysuria, hematuria and testicular pain.  Musculoskeletal: Negative for arthralgias and myalgias.  Neurological: Negative for dizziness, weakness and headaches.   all other systems are negative except as noted in the HPI and PMH.     Physical Exam Updated Vital Signs BP (!) 146/69 (BP Location: Left Arm)   Pulse 71   Temp 98 F (36.7 C) (Oral)   Resp 13   Ht 5\' 11"  (1.803 m)    Wt (!) 168.7 kg (372 lb)   SpO2 98%   BMI 51.88 kg/m   Physical Exam  Constitutional: He is oriented to person, place, and time. He appears well-developed and well-nourished. No distress.  Uncomfortable appearing  HENT:  Head: Normocephalic and atraumatic.  Mouth/Throat: Oropharynx is clear and moist. No oropharyngeal exudate.  Eyes: Pupils are equal, round, and reactive to light. Conjunctivae and EOM are normal.  Neck: Normal range of motion. Neck supple.  No meningismus.  Cardiovascular: Normal rate, regular rhythm, normal heart sounds and intact distal pulses.   No murmur heard. Pulmonary/Chest: Effort normal and breath sounds normal. No respiratory distress. He exhibits tenderness.  L lateral rib tenderness  Abdominal: Soft. There is tenderness. There is no rebound and no guarding.  Obese TTP diffusely in upper abdomen with voluntary guarding Reducible nontender umbilical hernia.   Musculoskeletal: Normal range of motion. He exhibits no edema or tenderness.  Neurological: He is alert and oriented to person, place, and time. No cranial nerve deficit. He exhibits normal muscle tone. Coordination normal.   5/5 strength throughout. CN 2-12 intact.Equal grip strength.   Skin: Skin is warm.  Psychiatric: He has a normal mood and affect. His behavior is normal.  Nursing note and vitals reviewed.    ED Treatments / Results  Labs (all labs ordered are listed, but only abnormal results are displayed) Labs Reviewed  COMPREHENSIVE METABOLIC PANEL - Abnormal; Notable for the following:       Result Value   Glucose, Bld 118 (*)    Calcium 8.4 (*)    Albumin 3.2 (*)    AST 13 (*)    All other components within normal limits  D-DIMER, QUANTITATIVE (NOT AT [facility]) - Abnormal; Notable for the following:    D-Dimer, Quant 0.75 (*)    All other components within normal limits  CBG MONITORING, ED - Abnormal; Notable for the following:    Glucose-Capillary 106 (*)    All other components  within normal limits  CBC WITH DIFFERENTIAL/PLATELET  LIPASE, BLOOD  TROPONIN I    EKG  EKG Interpretation  Date/Time:  Saturday March 17 2017 05:23:14 EDT Ventricular Rate:  77 PR Interval:    QRS Duration: 80 QT Interval:  362 QTC Calculation: 410 R Axis:   41 Text Interpretation:  Sinus rhythm Borderline prolonged PR interval Probable left atrial enlargement RSR' in V1 or V2, right VCD or RVH T wave changes improved  No significant change was found Confirmed by Ezequiel Essex [phone]) on 03/17/2017 5:29:37 AM       Radiology Ct Angio Chest Pe W And/or Wo Contrast  Result Date: 03/17/2017 CLINICAL DATA:  Shortness of breath and chest and abdominal pain, initial encounter EXAM: CT ANGIOGRAPHY CHEST CT ABDOMEN AND PELVIS WITH CONTRAST TECHNIQUE: Multidetector CT imaging of the chest was performed using the standard protocol during  bolus administration of intravenous contrast. Multiplanar CT image reconstructions and MIPs were obtained to evaluate the vascular anatomy. Multidetector CT imaging of the abdomen and pelvis was performed using the standard protocol during bolus administration of intravenous contrast. CONTRAST:  100 mL Isovue 370 COMPARISON:  Plain films from earlier in the same day. FINDINGS: CTA CHEST FINDINGS Cardiovascular: Thoracic aorta shows slight variant anatomy with origin of the left subclavian artery from the aorta directly. No significant atherosclerotic disease or aneurysmal dilatation is identified. Heart is at the upper limits of normal in size. The pulmonary artery shows a normal branching pattern. No definitive filling defect to suggest pulmonary embolus is identified. Very minimal coronary calcifications are noted. Mediastinum/Nodes: The thoracic aorta is unremarkable. Mild mediastinal lipomatosis is noted. Scattered small mediastinal lymph nodes are noted although not significant by size criteria. No significant hilar adenopathy is identified. The esophagus  is within normal limits. Lungs/Pleura: The lungs are well aerated bilaterally and demonstrate some patchy interstitial change. This may be related to a component of contusion given the multiple rib fractures. No sizable effusion is seen. No pneumothorax is noted. Musculoskeletal: Mild degenerative changes of the thoracic spine are noted. There are multiple healing fractures identified up on the left involving the sixth through ninth ribs posterolaterally. Some callus formation is identified. Review of the MIP images confirms the above findings. CT ABDOMEN and PELVIS FINDINGS Hepatobiliary: No focal liver abnormality is seen. No gallstones, gallbladder wall thickening, or biliary dilatation. Pancreas: Unremarkable. No pancreatic ductal dilatation or surrounding inflammatory changes. Spleen: Normal in size without focal abnormality. Adrenals/Urinary Tract: The adrenal glands are within normal limits. The kidneys demonstrate a normal enhancement pattern. No obstructive changes are seen. No renal calculi are noted. Delayed images demonstrate normal excretion of contrast. The bladder is predominately decompressed. Stomach/Bowel: Stomach is within normal limits. Appendix appears normal. No evidence of bowel wall thickening, distention, or inflammatory changes. Vascular/Lymphatic: Aortic atherosclerosis. No enlarged abdominal or pelvic lymph nodes. Reproductive: Prostate is unremarkable. Other: There is a fat containing umbilical hernia identified. The neck measures 2.7 cm in greatest transverse dimension. Just superior to this there is a second periumbilical fat containing hernia. The neck measures 4 cm in greatest dimension. The hernia itself measures approximately 12.5 cm in greatest dimension. Musculoskeletal: Mild degenerative changes of the lumbar spine are noted. Adjacent to the pubic symphysis there are small areas of fluid attenuation most prominent posteriorly likely related to some synovial changes. No changes  of osteomyelitis are noted. Review of the MIP images confirms the above findings. IMPRESSION: CTA of the chest: No evidence of pulmonary emboli. Multiple left rib fractures with callus formation. No pneumothorax is identified. Mild patchy changes in the lungs particularly on the left which may be related to a degree of contusion. No focal confluent infiltrate is seen. CT of the abdomen and pelvis: Fat containing umbilical and periumbilical hernias. Degenerative changes of the pubic symphysis with some synovial reaction. Electronically Signed   By: Inez Catalina M.D.   On: 03/17/2017 07:23   Ct Abdomen Pelvis W Contrast  Result Date: 03/17/2017 CLINICAL DATA:  Shortness of breath and chest and abdominal pain, initial encounter EXAM: CT ANGIOGRAPHY CHEST CT ABDOMEN AND PELVIS WITH CONTRAST TECHNIQUE: Multidetector CT imaging of the chest was performed using the standard protocol during bolus administration of intravenous contrast. Multiplanar CT image reconstructions and MIPs were obtained to evaluate the vascular anatomy. Multidetector CT imaging of the abdomen and pelvis was performed using the standard protocol during bolus  administration of intravenous contrast. CONTRAST:  100 mL Isovue 370 COMPARISON:  Plain films from earlier in the same day. FINDINGS: CTA CHEST FINDINGS Cardiovascular: Thoracic aorta shows slight variant anatomy with origin of the left subclavian artery from the aorta directly. No significant atherosclerotic disease or aneurysmal dilatation is identified. Heart is at the upper limits of normal in size. The pulmonary artery shows a normal branching pattern. No definitive filling defect to suggest pulmonary embolus is identified. Very minimal coronary calcifications are noted. Mediastinum/Nodes: The thoracic aorta is unremarkable. Mild mediastinal lipomatosis is noted. Scattered small mediastinal lymph nodes are noted although not significant by size criteria. No significant hilar adenopathy  is identified. The esophagus is within normal limits. Lungs/Pleura: The lungs are well aerated bilaterally and demonstrate some patchy interstitial change. This may be related to a component of contusion given the multiple rib fractures. No sizable effusion is seen. No pneumothorax is noted. Musculoskeletal: Mild degenerative changes of the thoracic spine are noted. There are multiple healing fractures identified up on the left involving the sixth through ninth ribs posterolaterally. Some callus formation is identified. Review of the MIP images confirms the above findings. CT ABDOMEN and PELVIS FINDINGS Hepatobiliary: No focal liver abnormality is seen. No gallstones, gallbladder wall thickening, or biliary dilatation. Pancreas: Unremarkable. No pancreatic ductal dilatation or surrounding inflammatory changes. Spleen: Normal in size without focal abnormality. Adrenals/Urinary Tract: The adrenal glands are within normal limits. The kidneys demonstrate a normal enhancement pattern. No obstructive changes are seen. No renal calculi are noted. Delayed images demonstrate normal excretion of contrast. The bladder is predominately decompressed. Stomach/Bowel: Stomach is within normal limits. Appendix appears normal. No evidence of bowel wall thickening, distention, or inflammatory changes. Vascular/Lymphatic: Aortic atherosclerosis. No enlarged abdominal or pelvic lymph nodes. Reproductive: Prostate is unremarkable. Other: There is a fat containing umbilical hernia identified. The neck measures 2.7 cm in greatest transverse dimension. Just superior to this there is a second periumbilical fat containing hernia. The neck measures 4 cm in greatest dimension. The hernia itself measures approximately 12.5 cm in greatest dimension. Musculoskeletal: Mild degenerative changes of the lumbar spine are noted. Adjacent to the pubic symphysis there are small areas of fluid attenuation most prominent posteriorly likely related to some  synovial changes. No changes of osteomyelitis are noted. Review of the MIP images confirms the above findings. IMPRESSION: CTA of the chest: No evidence of pulmonary emboli. Multiple left rib fractures with callus formation. No pneumothorax is identified. Mild patchy changes in the lungs particularly on the left which may be related to a degree of contusion. No focal confluent infiltrate is seen. CT of the abdomen and pelvis: Fat containing umbilical and periumbilical hernias. Degenerative changes of the pubic symphysis with some synovial reaction. Electronically Signed   By: Inez Catalina M.D.   On: 03/17/2017 07:23   Dg Abdomen Acute W/chest  Result Date: 03/17/2017 CLINICAL DATA:  History of pericarditis. Right-sided chest pressure. Shortness of breath. Pain now on the upper abdomen. Umbilical hernia. EXAM: DG ABDOMEN ACUTE W/ 1V CHEST COMPARISON:  Chest 02/21/2017 FINDINGS: Cardiac enlargement. No vascular congestion or edema. No focal consolidation. No blunting of costophrenic angles. No pneumothorax. Gas and stool throughout the colon. No small or large bowel distention. No free intra-abdominal air. No abnormal air-fluid levels. No radiopaque stones. Visualized bones appear intact. IMPRESSION: Cardiac enlargement. No evidence of active pulmonary disease. Nonobstructive bowel gas pattern. Electronically Signed   By: Lucienne Capers M.D.   On: 03/17/2017 06:15  Procedures Procedures (including critical care time)  Medications Ordered in ED Medications  sodium chloride 0.9 % bolus 1,000 mL (1,000 mLs Intravenous New Bag/Given 03/17/17 0554)  ondansetron (ZOFRAN) injection 4 mg (4 mg Intravenous Given 03/17/17 0554)  fentaNYL (SUBLIMAZE) injection 50 mcg (50 mcg Intravenous Given 03/17/17 0556)     Initial Impression / Assessment and Plan / ED Course  I have reviewed the triage vital signs and the nursing notes.  Pertinent labs & imaging results that were available during my care of the  patient were reviewed by me and considered in my medical decision making (see chart for details).    2 days of upper abdominal pain with shortness of breath and pain with breathing.  Moved from his right-sided chest 2 days ago.  Patient obese and uncomfortable appearing.  EKG is nonischemic.  Chest wall is nontender. Left lateral ribs are tender.  Diffuse upper abdominal tenderness concerning for possible gallbladder pathology.  We will also obtain d-dimer to evaluate his chest pain and shortness of breath.  LFTs and lipase are normal. D-dimer slightly elevated. Troponin negative. Ongoing pain for 3 days.  1 troponin effectively rules out ACS.  CT is negative for PE.  Does show multiple left-sided subacute rib fractures.  This is likely the source of patient's left-sided pain. CT abdomen is negative.  Gallbladder appears normal.  Patient will be treated with pain medication, add PPI. Incentive spirometry. No increased work of breathing or hypoxia.  Follow-up with his PCP.  He does have echocardiogram scheduled for later this week. Return precautions discussed.  Final Clinical Impressions(s) / ED Diagnoses   Final diagnoses:  Closed fracture of multiple ribs of left side, sequela  Upper abdominal pain    New Prescriptions New Prescriptions   No medications on file     Ezequiel Essex, MD 03/17/17 [id number]

## 2017-03-17 NOTE — ED Triage Notes (Signed)
Pt states he has a history of pericarditis  Pt states that he had right sided chest pressure  Pt states he feels short of breath when laying back  Pt States the pain has moved from the right side to the left side and to the upper abdomen.  Pt states he has a pain level of 7 on a scale of 0 to 10

## 2017-03-17 NOTE — ED Notes (Signed)
EDP at bedside  

## 2017-03-17 NOTE — Discharge Instructions (Signed)
Take the pain medications as prescribed. Encourage yourself to take deep breaths. Take the stomach medications as prescribed. Avoid alcohol, caffeine, NSAIDs. Follow up with your doctor. Return to the ED if you develop new or worsening symptoms.

## 2017-03-19 ENCOUNTER — Ambulatory Visit (HOSPITAL_COMMUNITY): Admission: RE | Admit: 2017-03-19 | Payer: Self-pay | Source: Ambulatory Visit

## 2017-03-20 ENCOUNTER — Telehealth: Payer: Self-pay | Admitting: Student

## 2017-03-20 NOTE — Telephone Encounter (Signed)
-----   Message from Soyla Dryer, [place] sent at 03/19/2017  8:24 PM EDT ----- Regarding: RE: Medication problem Pt pain likely caused by his rib fx.  His a1c was 6.5 so we can just discuss rx at his f/u OV in November.   ----- Message ----- From: Jinny Blossom, LPN Sent: 58/59/2924  11:24 AM To: Soyla Dryer, PA-C Subject: Medication problem                             Spoke with patient and he states he took metformin only twice and has been experiencing L sided abd pain, urinary frequency, and diarrhea. Pt has d/c medication on his own. Pt states if new medication is prescribed he would prefer for it to be sent to [facility]. Thanks.

## 2017-03-20 NOTE — Telephone Encounter (Signed)
Called and notified pt that pain likely due to rib fx. Pt verbalized understanding and stated he has already begun taking metformin again and had not had problems. Pt is f/u as scheduled. Pt verbalized understanding.

## 2017-04-02 ENCOUNTER — Other Ambulatory Visit: Payer: Self-pay | Admitting: Physician Assistant

## 2017-04-02 ENCOUNTER — Telehealth: Payer: Self-pay | Admitting: Physician Assistant

## 2017-04-02 ENCOUNTER — Ambulatory Visit (INDEPENDENT_AMBULATORY_CARE_PROVIDER_SITE_OTHER): Payer: Self-pay | Admitting: Otolaryngology

## 2017-04-02 DIAGNOSIS — D3709 Neoplasm of uncertain behavior of other specified sites of the oral cavity: Secondary | ICD-10-CM

## 2017-04-02 DIAGNOSIS — J351 Hypertrophy of tonsils: Secondary | ICD-10-CM

## 2017-04-02 MED ORDER — DICLOFENAC SODIUM 75 MG PO TBEC: 75.0000 mg | DELAYED_RELEASE_TABLET | Freq: Two times a day (BID) | ORAL | 0 refills | Status: DC | PRN

## 2017-04-02 NOTE — Telephone Encounter (Signed)
Patient called in this morning stating he was having pain from his broken ribs. Patient stated he could not sleep and was in need of pain medication. Patient stated he would go to ER if he could not get something for his pain. PCP called in diclofenic 75 mg to [facility] in [place].

## 2017-04-09 ENCOUNTER — Encounter: Payer: Self-pay | Admitting: Physician Assistant

## 2017-04-09 ENCOUNTER — Ambulatory Visit: Payer: Self-pay | Admitting: Physician Assistant

## 2017-04-09 VITALS — BP 130/86 | HR 75 | Temp 97.0°F | Ht 71.0 in | Wt 375.5 lb

## 2017-04-09 DIAGNOSIS — S2242XD Multiple fractures of ribs, left side, subsequent encounter for fracture with routine healing: Secondary | ICD-10-CM

## 2017-04-09 DIAGNOSIS — M7989 Other specified soft tissue disorders: Secondary | ICD-10-CM

## 2017-04-09 DIAGNOSIS — E785 Hyperlipidemia, unspecified: Secondary | ICD-10-CM

## 2017-04-09 DIAGNOSIS — F17210 Nicotine dependence, cigarettes, uncomplicated: Secondary | ICD-10-CM

## 2017-04-09 DIAGNOSIS — Z6841 Body Mass Index (BMI) 40.0 and over, adult: Secondary | ICD-10-CM

## 2017-04-09 DIAGNOSIS — S2242XA Multiple fractures of ribs, left side, initial encounter for closed fracture: Secondary | ICD-10-CM | POA: Insufficient documentation

## 2017-04-09 DIAGNOSIS — I10 Essential (primary) hypertension: Secondary | ICD-10-CM

## 2017-04-09 DIAGNOSIS — E119 Type 2 diabetes mellitus without complications: Secondary | ICD-10-CM

## 2017-04-09 NOTE — Progress Notes (Signed)
BP 130/86 (BP Location: Left Arm, Patient Position: Sitting, Cuff Size: Large)   Pulse 75   Temp (!) 97 F (36.1 C)   Ht 5\' 11"  (1.803 m)   Wt (!) 375 lb 8 oz (170.3 kg)   SpO2 95%   BMI 52.37 kg/m    Subjective:    Patient ID: Thomas Myers, male    DOB: [birth year], 46 y.o.   MRN: [id number]  HPI: Thomas Myers is a 46 y.o. male presenting on 04/09/2017 for Follow-up   HPI   Pt is feeling better.  He says the diclofenac has helped "a bunch a bunch".    Pt no-showed for echo on 03/19/17,  He says his mother wrote it down as Nov 22, not Oct 22.   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  Pt says he did visualization of the throat and it was normal.   He has not been to the DM education class yet.   Pt says he is no longer having chest pains since he started the diclofenac.  He says that he feels like his usual self since starting that medcation for the first time in 2 months.  He says no more thumping of the chest.   Pt states swelling of LLE has improved as well.    Relevant past medical, surgical, family and social history reviewed and updated as indicated. Interim medical history since our last visit reviewed. Allergies and medications reviewed and updated.   Current Outpatient Medications:  .  diclofenac (VOLTAREN) 75 MG EC tablet, Take 1 tablet (75 mg total) 2 (two) times daily as needed by mouth (pain)., Disp: 60 tablet, Rfl: 0 .  lisinopril (PRINIVIL,ZESTRIL) 10 MG tablet, Take 1 tablet (10 mg total) by mouth daily., Disp: 30 tablet, Rfl: 1 .  metFORMIN (GLUCOPHAGE XR) 500 MG 24 hr tablet, Take 1 tablet (500 mg total) by mouth daily with breakfast., Disp: 30 tablet, Rfl: 2 .  omeprazole (PRILOSEC) 20 MG capsule, Take 1 capsule (20 mg total) by mouth daily., Disp: 30 capsule, Rfl: 0 .  simvastatin (ZOCOR) 20 MG tablet, Take 1 tablet (20 mg total) by mouth at bedtime., Disp: 30 tablet, Rfl:  3   Review of Systems  Constitutional: Negative for appetite change, chills, diaphoresis, fatigue, fever and unexpected weight change.  HENT: Negative for congestion, dental problem, drooling, ear pain, facial swelling, hearing loss, mouth sores, sneezing, sore throat, trouble swallowing and voice change.   Eyes: Negative for pain, discharge, redness, itching and visual disturbance.  Respiratory: Negative for cough, choking, shortness of breath and wheezing.   Cardiovascular: Positive for leg swelling. Negative for chest pain and palpitations.  Gastrointestinal: Negative for abdominal pain, blood in stool, constipation, diarrhea and vomiting.  Endocrine: Negative for cold intolerance, heat intolerance and polydipsia.  Genitourinary: Negative for decreased urine volume, dysuria and hematuria.  Musculoskeletal: Negative for arthralgias, back pain and gait problem.  Skin: Negative for rash.  Allergic/Immunologic: Negative for environmental allergies.  Neurological: Negative for seizures, syncope, light-headedness and headaches.  Hematological: Negative for adenopathy.  Psychiatric/Behavioral: Negative for agitation, dysphoric mood and suicidal ideas. The patient is not nervous/anxious.     Per HPI unless specifically indicated above     Objective:    BP 130/86 (BP Location: Left Arm, Patient Position: Sitting, Cuff Size: Large)   Pulse 75   Temp (!) 97 F (36.1 C)  Ht 5\' 11"  (1.803 m)   Wt (!) 375 lb 8 oz (170.3 kg)   SpO2 95%   BMI 52.37 kg/m   Wt Readings from Last 3 Encounters:  04/09/17 (!) 375 lb 8 oz (170.3 kg)  03/17/17 (!) 372 lb (168.7 kg)  03/12/17 (!) 375 lb (170.1 kg)    Physical Exam  Constitutional: He is oriented to person, place, and time. He appears well-developed and well-nourished.  HENT:  Head: Normocephalic and atraumatic.  Neck: Neck supple.  Cardiovascular: Normal rate and regular rhythm.  Pulmonary/Chest: Effort normal and breath sounds normal. He  has no wheezes.  Abdominal: Soft. Bowel sounds are normal. There is no hepatosplenomegaly. There is no tenderness.  Musculoskeletal: He exhibits edema (LLE slightly larger than RLE).  Lymphadenopathy:    He has no cervical adenopathy.  Neurological: He is alert and oriented to person, place, and time.  Skin: Skin is warm and dry.  Psychiatric: He has a normal mood and affect. His behavior is normal.  Much more upbeat than at previous [place] reviewed.      Assessment & Plan:    Encounter Diagnoses  Name Primary?  . Diabetes mellitus without complication ([place]) Yes  . Essential hypertension   . BMI 50.0-59.9, adult ([place])   . Morbid obesity ([place])   . Hyperlipidemia, unspecified hyperlipidemia type   . Leg swelling   . Closed fracture of multiple ribs of left side with routine healing, subsequent encounter   . Cigarette nicotine dependence without complication     -Will not reschedule echo since pt is no longer having his chest pains or palpitations.  -will refer for DM  eye exam,  -will Send urine for microalbumn -pt to continue current medications -pt counseled to go to Dm education class -counseled on smoking cessation -counseled on diet and exercise to help manage medical conditions -pt to follow up in 2 months.  RTO sooner prn

## 2017-04-10 ENCOUNTER — Other Ambulatory Visit (HOSPITAL_COMMUNITY)
Admission: RE | Admit: 2017-04-10 | Discharge: 2017-04-10 | Disposition: A | Discharge status: Home / Self Care | Payer: Self-pay | Source: Other Acute Inpatient Hospital | Attending: *Deleted | Admitting: *Deleted

## 2017-04-10 DIAGNOSIS — E119 Type 2 diabetes mellitus without complications: Secondary | ICD-10-CM | POA: Insufficient documentation

## 2017-04-11 LAB — MICROALBUMIN, URINE: MICROALB UR: 25.2 ug/mL — AB

## 2017-04-18 ENCOUNTER — Other Ambulatory Visit: Payer: Self-pay | Admitting: Physician Assistant

## 2017-04-23 ENCOUNTER — Telehealth: Payer: Self-pay | Admitting: Physician Assistant

## 2017-04-23 ENCOUNTER — Other Ambulatory Visit: Payer: Self-pay | Admitting: Physician Assistant

## 2017-04-23 MED ORDER — DICLOFENAC SODIUM 75 MG PO TBEC: 75.0000 mg | DELAYED_RELEASE_TABLET | Freq: Two times a day (BID) | ORAL | 0 refills | Status: DC | PRN

## 2017-04-23 NOTE — Telephone Encounter (Signed)
Pt called about refill for diclofenac.  Discussed with pt that he should not be out of it yet as he got month supply on 04/02/17.  He says he has been hurting again same as he was when he went to ER last time and was diagnosed with rib fracture.  He denies SOB.  He says he doesn't know if he's having pain because he's out of his medication.  Offered pt an appointment today but he declined.  Refill sent with counseling to pt to not take more than 2 tablets daily.  He says he will call for appt if he continues to have pain after resuming his Rx.

## 2017-04-26 ENCOUNTER — Ambulatory Visit: Payer: Self-pay | Admitting: Physician Assistant

## 2017-04-26 ENCOUNTER — Encounter: Payer: Self-pay | Admitting: Physician Assistant

## 2017-04-26 ENCOUNTER — Ambulatory Visit (HOSPITAL_COMMUNITY)
Admission: RE | Admit: 2017-04-26 | Discharge: 2017-04-26 | Disposition: A | Discharge status: Home / Self Care | Payer: Self-pay | Source: Ambulatory Visit | Attending: Physician Assistant | Admitting: Physician Assistant

## 2017-04-26 VITALS — BP 147/82 | HR 80 | Temp 97.5°F

## 2017-04-26 DIAGNOSIS — X58XXXD Exposure to other specified factors, subsequent encounter: Secondary | ICD-10-CM | POA: Insufficient documentation

## 2017-04-26 DIAGNOSIS — R0781 Pleurodynia: Secondary | ICD-10-CM

## 2017-04-26 DIAGNOSIS — S2242XD Multiple fractures of ribs, left side, subsequent encounter for fracture with routine healing: Secondary | ICD-10-CM | POA: Insufficient documentation

## 2017-04-26 DIAGNOSIS — I517 Cardiomegaly: Secondary | ICD-10-CM | POA: Insufficient documentation

## 2017-04-26 MED ORDER — CELECOXIB 200 MG PO CAPS: 200.0000 mg | ORAL_CAPSULE | Freq: Two times a day (BID) | ORAL | 0 refills | Status: DC

## 2017-04-26 NOTE — Progress Notes (Signed)
BP (!) 147/82   Pulse 80   Temp (!) 97.5 F (36.4 C)   SpO2 94%    Subjective:    Patient ID: Thomas Myers, male    DOB: Nov 19, 1970, 46 y.o.   MRN: [id number]  HPI: Thomas Myers is a 46 y.o. male presenting on 04/26/2017 for other (pain)   HPI   Pt is here today because he is back to hurting. That started the day before THanksgiving.  He is coughing.  He says he just has a tickle that started this morning.  His nose is also running.   He says the diclofenac isn't working as well as it was previously because he was taking more than prescribed- he was taking 2 at a time and that was working.   Relevant past medical, surgical, family and social history reviewed and updated as indicated. Interim medical history since our last visit reviewed. Allergies and medications reviewed and updated.   Current Outpatient Medications:  .  diclofenac (VOLTAREN) 75 MG EC tablet, Take 1 tablet (75 mg total) by mouth 2 (two) times daily as needed (pain). (Do NOT exceed 2 tablets in 24 hour period), Disp: 60 tablet, Rfl: 0 .  lisinopril (PRINIVIL,ZESTRIL) 10 MG tablet, Take 1 tablet (10 mg total) by mouth daily., Disp: 30 tablet, Rfl: 1 .  metFORMIN (GLUCOPHAGE XR) 500 MG 24 hr tablet, Take 1 tablet (500 mg total) by mouth daily with breakfast., Disp: 30 tablet, Rfl: 2 .  omeprazole (PRILOSEC) 20 MG capsule, Take 1 capsule (20 mg total) by mouth daily., Disp: 30 capsule, Rfl: 0 .  simvastatin (ZOCOR) 20 MG tablet, Take 1 tablet (20 mg total) by mouth at bedtime., Disp: 30 tablet, Rfl: 3   Review of Systems  Constitutional: Negative for appetite change, chills, diaphoresis, fatigue, fever and unexpected weight change.  HENT: Negative for congestion, dental problem, drooling, ear pain, facial swelling, hearing loss, mouth sores, sneezing, sore throat, trouble swallowing and voice change.   Eyes: Negative for pain, discharge, redness, itching and visual disturbance.  Respiratory: Negative for  cough, choking, shortness of breath and wheezing.   Cardiovascular: Positive for chest pain and leg swelling. Negative for palpitations.  Gastrointestinal: Negative for abdominal pain, blood in stool, constipation, diarrhea and vomiting.  Endocrine: Negative for cold intolerance, heat intolerance and polydipsia.  Genitourinary: Negative for decreased urine volume, dysuria and hematuria.  Musculoskeletal: Negative for arthralgias, back pain and gait problem.  Skin: Negative for rash.  Allergic/Immunologic: Negative for environmental allergies.  Neurological: Negative for seizures, syncope, light-headedness and headaches.  Hematological: Negative for adenopathy.  Psychiatric/Behavioral: Negative for agitation, dysphoric mood and suicidal ideas. The patient is not nervous/anxious.     Per HPI unless specifically indicated above     Objective:    BP (!) 147/82   Pulse 80   Temp (!) 97.5 F (36.4 C)   SpO2 94%   Wt Readings from Last 3 Encounters:  04/09/17 (!) 375 lb 8 oz (170.3 kg)  03/17/17 (!) 372 lb (168.7 kg)  03/12/17 (!) 375 lb (170.1 kg)    Physical Exam  Constitutional: He is oriented to person, place, and time. He appears well-developed and well-nourished.  HENT:  Head: Normocephalic and atraumatic.  Neck: Neck supple.  Cardiovascular: Normal rate and regular rhythm.  Pulmonary/Chest: Effort normal and breath sounds normal. He has no wheezes. He exhibits tenderness and bony tenderness.  L sided tenderness anterior and posterior  Abdominal: Soft. Bowel sounds are normal. There is  no hepatosplenomegaly. There is no tenderness.  Musculoskeletal: He exhibits edema.  Lymphadenopathy:    He has no cervical adenopathy.  Neurological: He is alert and oriented to person, place, and time.  Skin: Skin is warm and dry.  Psychiatric: He has a normal mood and affect. His behavior is normal.  Vitals reviewed.       Assessment & Plan:   Encounter Diagnoses  Name Primary?  .  Rib pain on left side Yes  . Closed fracture of multiple ribs of left side with routine healing, subsequent encounter     -Pt says he was not coughing at home and doesn't need rx for cough -update CXR today -will try celebrex for the pain.  Pt is counseled to not take the diclofenac when he is taking the celebrex -counseled pt to avoid smoking -follow up January as scheduled.  RTO sooner prn

## 2017-05-07 ENCOUNTER — Other Ambulatory Visit: Payer: Self-pay | Admitting: Physician Assistant

## 2017-05-16 ENCOUNTER — Other Ambulatory Visit: Payer: Self-pay | Admitting: Physician Assistant

## 2017-05-24 ENCOUNTER — Other Ambulatory Visit: Payer: Self-pay | Admitting: Physician Assistant

## 2017-05-26 ENCOUNTER — Emergency Department (HOSPITAL_COMMUNITY)
Admission: EM | Admit: 2017-05-26 | Discharge: 2017-05-27 | Disposition: A | Discharge status: Home / Self Care | Payer: Self-pay | Attending: Emergency Medicine | Admitting: Emergency Medicine

## 2017-05-26 ENCOUNTER — Other Ambulatory Visit: Payer: Self-pay

## 2017-05-26 ENCOUNTER — Emergency Department (HOSPITAL_COMMUNITY): Payer: Self-pay

## 2017-05-26 ENCOUNTER — Encounter (HOSPITAL_COMMUNITY): Payer: Self-pay | Admitting: *Deleted

## 2017-05-26 DIAGNOSIS — Z7984 Long term (current) use of oral hypoglycemic drugs: Secondary | ICD-10-CM | POA: Insufficient documentation

## 2017-05-26 DIAGNOSIS — I10 Essential (primary) hypertension: Secondary | ICD-10-CM | POA: Insufficient documentation

## 2017-05-26 DIAGNOSIS — E119 Type 2 diabetes mellitus without complications: Secondary | ICD-10-CM | POA: Insufficient documentation

## 2017-05-26 DIAGNOSIS — X509XXA Other and unspecified overexertion or strenuous movements or postures, initial encounter: Secondary | ICD-10-CM | POA: Insufficient documentation

## 2017-05-26 DIAGNOSIS — F17210 Nicotine dependence, cigarettes, uncomplicated: Secondary | ICD-10-CM | POA: Insufficient documentation

## 2017-05-26 DIAGNOSIS — Z79899 Other long term (current) drug therapy: Secondary | ICD-10-CM | POA: Insufficient documentation

## 2017-05-26 DIAGNOSIS — S2232XG Fracture of one rib, left side, subsequent encounter for fracture with delayed healing: Secondary | ICD-10-CM | POA: Insufficient documentation

## 2017-05-26 DIAGNOSIS — R071 Chest pain on breathing: Secondary | ICD-10-CM | POA: Insufficient documentation

## 2017-05-26 MED ORDER — ONDANSETRON 4 MG PO TBDP
4.0000 mg | ORAL_TABLET | Freq: Once | ORAL | Status: AC
  Administered 2017-05-27: 4 mg via ORAL
  Filled 2017-05-26: qty 1

## 2017-05-26 MED ORDER — IBUPROFEN 400 MG PO TABS
400.0000 mg | ORAL_TABLET | ORAL | Status: AC
  Administered 2017-05-26: 400 mg via ORAL
  Filled 2017-05-26: qty 1

## 2017-05-26 MED ORDER — HYDROMORPHONE HCL 1 MG/ML IJ SOLN
1.0000 mg | Freq: Once | INTRAMUSCULAR | Status: AC
  Administered 2017-05-27: 1 mg via INTRAMUSCULAR
  Filled 2017-05-26: qty 1

## 2017-05-26 NOTE — ED Triage Notes (Signed)
He said that he coughed and felt a pop in his left ribs today and experienced a sharp pain. Hurts worse to take a deep breath. Pt reports he had left sided rib fractures in October. No meds PTA.

## 2017-05-26 NOTE — ED Notes (Signed)
Pt c/o increased pain since returning from xray. Brought pt back to triage for reassessment. VSS and no change in lung sounds. Provided patient with blanket to help in him with splinting the left side when he coughs.

## 2017-05-27 MED ORDER — HYDROCODONE-ACETAMINOPHEN 5-325 MG PO TABS: 1.0000 | ORAL_TABLET | ORAL | 0 refills | Status: DC | PRN

## 2017-05-27 NOTE — ED Provider Notes (Signed)
[facility] EMERGENCY DEPARTMENT Provider Note   CSN: [id number] Arrival date & time: 05/26/17  1957     History   Chief Complaint Chief Complaint  Patient presents with  . Chest Pain    (Rib Pain)    HPI Thomas Myers is a 46 y.o. male with a history of diabetes, hypertension and morbid obesity and recent multiple left rib fractures supposedly secondary to chronic coughing as patient denies trauma presenting with new onset of left rib cage pain.  He states he was simply sitting at home and had a small cough episode and had sudden onset of severe localized left chest wall pain in association with a popping sensation.  He denies shortness of breath but does have persistent pain with deep inspiration but also movement and palpation at the site.  He has had no treatment prior to arrival.  The history is provided by the patient.    Past Medical History:  Diagnosis Date  . Diabetes mellitus without complication ([place])   . Hypercholesteremia 03/17/2017  . Hypertension     Patient Active Problem List   Diagnosis Date Noted  . BMI 50.0-59.9, adult ([place]) 04/09/2017  . Morbid obesity ([place]) 04/09/2017  . Multiple closed fractures of ribs of left side 04/09/2017  . Diabetes mellitus without complication ([place]) 14/43/1540  . Hyperlipidemia 03/12/2017  . Essential hypertension 03/11/2017    History reviewed. No pertinent surgical history.     Home Medications    Prior to Admission medications   Medication Sig Start Date End Date Taking? Authorizing Provider  celecoxib (CELEBREX) 200 MG capsule TAKE 1 CAPSULE (200 MG TOTAL) BY MOUTH 2 (TWO) TIMES DAILY AS NEEDED. 05/24/17   Soyla Dryer, PA-C  diclofenac (VOLTAREN) 75 MG EC tablet Take 1 tablet (75 mg total) by mouth 2 (two) times daily as needed (pain). (Do NOT exceed 2 tablets in 24 hour period) 04/23/17   Soyla Dryer, PA-C  HYDROcodone-acetaminophen (NORCO/VICODIN) 5-325 MG tablet Take 1-2 tablets by mouth every 4  (four) hours as needed for severe pain. 05/27/17   Evalee Jefferson, PA-C  lisinopril (PRINIVIL,ZESTRIL) 10 MG tablet TAKE 1 TABLET BY MOUTH ONCE DAILY 05/16/17   Soyla Dryer, PA-C  metFORMIN (GLUCOPHAGE XR) 500 MG 24 hr tablet Take 1 tablet (500 mg total) by mouth daily with breakfast. 03/12/17   Soyla Dryer, PA-C  omeprazole (PRILOSEC) 20 MG capsule Take 1 capsule (20 mg total) by mouth daily. 03/17/17   Rancour, Annie Main, MD  simvastatin (ZOCOR) 20 MG tablet Take 1 tablet (20 mg total) by mouth at bedtime. 03/12/17   Soyla Dryer, PA-C    Family History Family History  Problem Relation Age of Onset  . Cancer Father        Melanoma  . Hypertension Father   . Cancer Maternal Grandmother        Lung  . Cancer Maternal Grandfather   . Diabetes Maternal Grandfather   . Cancer Paternal Grandmother   . Cancer Paternal Grandfather     Social History Social History   Tobacco Use  . Smoking status: Current Every Day Smoker    Packs/day: 0.75    Years: 23.00    Pack years: 17.25    Types: Cigarettes  . Smokeless tobacco: Never Used  . Tobacco comment: is using vapes to slow down cigarettes  Substance Use Topics  . Alcohol use: No  . Drug use: No     Allergies   Patient has no known allergies.   Review of  Systems Review of Systems  Constitutional: Negative for fever.  HENT: Negative for congestion and sore throat.   Eyes: Negative.   Respiratory: Negative for chest tightness, shortness of breath, wheezing and stridor.   Cardiovascular: Positive for chest pain. Negative for palpitations.  Gastrointestinal: Negative for abdominal pain and nausea.  Genitourinary: Negative.   Musculoskeletal: Negative for arthralgias, joint swelling and neck pain.  Skin: Negative.  Negative for rash and wound.  Neurological: Negative for dizziness, weakness, light-headedness, numbness and headaches.  Psychiatric/Behavioral: Negative.      Physical Exam Updated Vital Signs BP  117/75   Pulse 70   Temp 98.8 F (37.1 C) (Oral)   Resp (!) 22   Wt (!) 170.1 kg (375 lb)   SpO2 94%   BMI 52.30 kg/m   Physical Exam  Constitutional: He appears well-developed and well-nourished.  HENT:  Head: Normocephalic and atraumatic.  Eyes: Conjunctivae are normal.  Neck: Normal range of motion.  Cardiovascular: Normal rate, regular rhythm, normal heart sounds and intact distal pulses.  Pulmonary/Chest: Effort normal and breath sounds normal. No stridor. No respiratory distress. He has no decreased breath sounds. He has no wheezes. He has no rhonchi. He exhibits tenderness. He exhibits no edema and no swelling.  Patient is point tender at his left lateral rib cage mid axillary line.  No crepitus, no palpable deformity.    Abdominal: Soft. Bowel sounds are normal. There is no tenderness.  Musculoskeletal: Normal range of motion.  Neurological: He is alert.  Skin: Skin is warm and dry.  Psychiatric: He has a normal mood and affect.  Nursing note and vitals reviewed.    ED Treatments / Results  Labs (all labs ordered are listed, but only abnormal results are displayed) Labs Reviewed - No data to display  EKG  EKG Interpretation  Date/Time:  Saturday May 26 2017 20:07:10 EST Ventricular Rate:  74 PR Interval:  204 QRS Duration: 68 QT Interval:  338 QTC Calculation: 375 R Axis:   34 Text Interpretation:  Normal sinus rhythm Nonspecific T wave abnormality Abnormal ECG No significant change was found Confirmed by Ezequiel Essex [id number]) on 05/27/2017 12:03:57 AM       Radiology Dg Ribs Unilateral W/chest Left  Result Date: 05/26/2017 CLINICAL DATA:  Recent coughing episode with left-sided chest pain, initial encounter EXAM: LEFT RIBS AND CHEST - 3+ VIEW COMPARISON:  04/26/2017 FINDINGS: Cardiac shadow is mildly enlarged but stable. The lungs are well aerated bilaterally. No focal infiltrate or pneumothorax is noted. Multiple healing left rib fractures are  seen with callus formation. Some slight displacement of the eighth rib is noted at the fracture site. The possibility of a recurrent fracture could not be totally excluded. IMPRESSION: Healing rib fractures on the left are noted. The possibility of slight displacement of the eighth rib fracture could not be totally excluded when compared with the prior exam. Electronically Signed   By: Inez Catalina M.D.   On: 05/26/2017 20:48    Procedures Procedures (including critical care time)  Medications Ordered in ED Medications  ibuprofen (ADVIL,MOTRIN) tablet 400 mg (400 mg Oral Given 05/26/17 2052)  HYDROmorphone (DILAUDID) injection 1 mg (1 mg Intramuscular Given 05/27/17 0011)  ondansetron (ZOFRAN-ODT) disintegrating tablet 4 mg (4 mg Oral Given 05/27/17 0011)     Initial Impression / Assessment and Plan / ED Course  I have reviewed the triage vital signs and the nursing notes.  Pertinent labs & imaging results that were available during my care of the  patient were reviewed by me and considered in my medical decision making (see chart for details).     Exam findings is consistent with findings on patient's x-rays this evening.  Suspect new fracture at the site of the previous healing rib fracture.  Discussed splinting the site, incentive spirometer which was given patient.  Hydrocodone was prescribed.  Plan follow-up with his PCP for recheck in 1 week, recheck sooner for any shortness of breath, fevers or weakness.  Final Clinical Impressions(s) / ED Diagnoses   Final diagnoses:  Closed fracture of one rib of left side with delayed healing, subsequent encounter    ED Discharge Orders        Ordered    HYDROcodone-acetaminophen (NORCO/VICODIN) 5-325 MG tablet  Every 4 hours PRN     05/27/17 0048       Evalee Jefferson, PA-C 05/28/17 0511    Davonna Belling, MD 05/30/17 [phone]

## 2017-05-27 NOTE — Discharge Instructions (Signed)
You may take the hydrocodone prescribed for pain relief.  This will make you drowsy - do not drive within 4 hours of taking this medication.  Additionally you may continue taking your anti inflammatory (celebrex) which can also help relieve pain.

## 2017-06-04 ENCOUNTER — Other Ambulatory Visit (HOSPITAL_COMMUNITY)
Admission: RE | Admit: 2017-06-04 | Discharge: 2017-06-04 | Disposition: A | Discharge status: Home / Self Care | Payer: Self-pay | Source: Ambulatory Visit | Attending: Physician Assistant | Admitting: Physician Assistant

## 2017-06-04 ENCOUNTER — Ambulatory Visit (HOSPITAL_COMMUNITY)
Admission: RE | Admit: 2017-06-04 | Discharge: 2017-06-04 | Disposition: A | Discharge status: Home / Self Care | Payer: Self-pay | Source: Ambulatory Visit | Attending: Physician Assistant | Admitting: Physician Assistant

## 2017-06-04 ENCOUNTER — Ambulatory Visit: Payer: Self-pay | Admitting: Physician Assistant

## 2017-06-04 VITALS — BP 136/70 | HR 90 | Temp 97.5°F | Ht 71.0 in | Wt 376.5 lb

## 2017-06-04 DIAGNOSIS — R0781 Pleurodynia: Secondary | ICD-10-CM

## 2017-06-04 DIAGNOSIS — E785 Hyperlipidemia, unspecified: Secondary | ICD-10-CM | POA: Insufficient documentation

## 2017-06-04 DIAGNOSIS — R600 Localized edema: Secondary | ICD-10-CM | POA: Insufficient documentation

## 2017-06-04 DIAGNOSIS — R05 Cough: Secondary | ICD-10-CM

## 2017-06-04 DIAGNOSIS — F17219 Nicotine dependence, cigarettes, with unspecified nicotine-induced disorders: Secondary | ICD-10-CM

## 2017-06-04 DIAGNOSIS — Z6841 Body Mass Index (BMI) 40.0 and over, adult: Secondary | ICD-10-CM

## 2017-06-04 DIAGNOSIS — R059 Cough, unspecified: Secondary | ICD-10-CM

## 2017-06-04 DIAGNOSIS — B353 Tinea pedis: Secondary | ICD-10-CM

## 2017-06-04 DIAGNOSIS — I10 Essential (primary) hypertension: Secondary | ICD-10-CM

## 2017-06-04 DIAGNOSIS — M7989 Other specified soft tissue disorders: Secondary | ICD-10-CM

## 2017-06-04 DIAGNOSIS — E119 Type 2 diabetes mellitus without complications: Secondary | ICD-10-CM

## 2017-06-04 LAB — LIPID PANEL
Cholesterol: 142 mg/dL (ref 0–200)
HDL: 33 mg/dL — AB (ref >40)
LDL CALC: 89 mg/dL (ref 0–99)
TRIGLYCERIDES: 100 mg/dL (ref <150)
Total CHOL/HDL Ratio: 4.3 RATIO
VLDL: 20 mg/dL (ref 0–40)

## 2017-06-04 LAB — COMPREHENSIVE METABOLIC PANEL
ALK PHOS: 93 U/L (ref 38–126)
ALT: 25 U/L (ref 17–63)
AST: 16 U/L (ref 15–41)
Albumin: 3.3 g/dL — ABNORMAL LOW (ref 3.5–5.0)
Anion gap: 7 (ref 5–15)
BUN: 12 mg/dL (ref 6–20)
CALCIUM: 8.8 mg/dL — AB (ref 8.9–10.3)
CHLORIDE: 104 mmol/L (ref 101–111)
CO2: 27 mmol/L (ref 22–32)
Creatinine, Ser: 0.9 mg/dL (ref 0.61–1.24)
GFR calc Af Amer: >60 mL/min (ref >60)
GFR calc non Af Amer: >60 mL/min (ref >60)
Glucose, Bld: 110 mg/dL — ABNORMAL HIGH (ref 65–99)
Potassium: 4 mmol/L (ref 3.5–5.1)
SODIUM: 138 mmol/L (ref 135–145)
Total Bilirubin: 0.3 mg/dL (ref 0.3–1.2)
Total Protein: 6.8 g/dL (ref 6.5–8.1)

## 2017-06-04 LAB — HEMOGLOBIN A1C
HEMOGLOBIN A1C: 6.5 % — AB (ref 4.8–5.6)
MEAN PLASMA GLUCOSE: 139.85 mg/dL

## 2017-06-04 MED ORDER — CELECOXIB 200 MG PO CAPS: 200.0000 mg | ORAL_CAPSULE | Freq: Two times a day (BID) | ORAL | 0 refills | Status: DC | PRN

## 2017-06-04 MED ORDER — PROMETHAZINE-DM 6.25-15 MG/5ML PO SYRP: ORAL_SOLUTION | ORAL | 0 refills | Status: DC

## 2017-06-04 NOTE — Patient Instructions (Signed)
Athlete's Foot Athlete's foot (tinea pedis) is a fungal infection of the skin on the feet. It often occurs on the skin that is between or underneath the toes. It can also occur on the soles of the feet. The infection can spread from person to person (is contagious). What are the causes? Athlete's foot is caused by a fungus. This fungus grows in warm, moist places. Most people get athlete's foot by sharing shower stalls, towels, and wet floors with someone who is infected. Not washing your feet or changing your socks often enough can contribute to athlete's foot. What increases the risk? This condition is more likely to develop in:  Men.  People who have a weak body defense system (immune system).  People who have diabetes.  People who use public showers, such as at a gym.  People who wear heavy-duty shoes, such as industrial or military shoes.  Seasons with warm, humid weather.  What are the signs or symptoms? Symptoms of this condition include:  Itchy areas between the toes or on the soles of the feet.  White, flaky, or scaly areas between the toes or on the soles of the feet.  Very itchy small blisters between the toes or on the soles of the feet.  Small cuts on the skin. These cuts can become infected.  Thick or discolored toenails.  How is this diagnosed? This condition is diagnosed with a medical history and physical exam. Your health care provider may also take a skin or toenail sample to be examined. How is this treated? Treatment for this condition includes antifungal medicines. These may be applied as powders, ointments, or creams. In severe cases, an oral antifungal medicine may be given. Follow these instructions at home:  Apply or take over-the-counter and prescription medicines only as told by your health care provider.  Keep all follow-up visits as told by your health care provider. This is important.  Do not scratch your feet.  Keep your feet dry: ? Wear  cotton or wool socks. Change your socks every day or if they become wet. ? Wear shoes that allow air to circulate, such as sandals or canvas tennis shoes.  Wash and dry your feet: ? Every day or as told by your health care provider. ? After exercising. ? Including the area between your toes.  Do not share towels, nail clippers, or other personal items that touch your feet with others.  If you have diabetes, keep your blood sugar under control. How is this prevented?  Do not share towels.  Wear sandals in wet areas, such as locker rooms and shared showers.  Keep your feet dry: ? Wear cotton or wool socks. Change your socks every day or if they become wet. ? Wear shoes that allow air to circulate, such as sandals or canvas tennis shoes.  Wash and dry your feet after exercising. Pay attention to the area between your toes. Contact a health care provider if:  You have a fever.  You have swelling, soreness, warmth, or redness in your foot.  You are not getting better with treatment.  Your symptoms get worse.  You have new symptoms. This information is not intended to replace advice given to you by your health care provider. Make sure you discuss any questions you have with your health care provider. Document Released: 05/12/2000 Document Revised: 10/21/2015 Document Reviewed: 11/16/2014 Elsevier Interactive Patient Education  2018 Elsevier Inc.  

## 2017-06-04 NOTE — Progress Notes (Signed)
BP 136/70 (BP Location: Right Arm, Patient Position: Sitting, Cuff Size: Large)   Pulse 90   Temp (!) 97.5 F (36.4 C)   Ht 5\' 11"  (1.803 m)   Wt (!) 376 lb 8 oz (170.8 kg)   SpO2 96%   BMI 52.51 kg/m    Subjective:    Patient ID: Thomas Myers, male    DOB: [birth year], 47 y.o.   MRN: [id number]  HPI: Thomas Myers is a 47 y.o. male presenting on 06/04/2017 for Chest Pain (rib pain. pt states pain when sneezes, coughs, lies down)   HPI   Pt here for pain today.  He has regular appt next week for DM, etc  Pt states he went back to ER on 12/29- it appears he rebroke where he broke his rib in October after a cough.  Pt says that his LLE began swelling again at that time (12/29).  Pt says he also feels like he has fluid on his chest again like he did when he he had the pericarditits but then says it's a little different..   Pt had LE doppler 10/15.  Pt is still smoking but says he has cut back some.   Pt has some cough but denies SOB.    Relevant past medical, surgical, family and social history reviewed and updated as indicated. Interim medical history since our last visit reviewed. Allergies and medications reviewed and updated.   Current Outpatient Medications:  .  celecoxib (CELEBREX) 200 MG capsule, TAKE 1 CAPSULE (200 MG TOTAL) BY MOUTH 2 (TWO) TIMES DAILY AS NEEDED., Disp: 30 capsule, Rfl: 0 .  lisinopril (PRINIVIL,ZESTRIL) 10 MG tablet, TAKE 1 TABLET BY MOUTH ONCE DAILY, Disp: 30 tablet, Rfl: 1 .  metFORMIN (GLUCOPHAGE XR) 500 MG 24 hr tablet, Take 1 tablet (500 mg total) by mouth daily with breakfast., Disp: 30 tablet, Rfl: 2 .  omeprazole (PRILOSEC) 20 MG capsule, Take 1 capsule (20 mg total) by mouth daily. (Patient not taking: Reported on 06/04/2017), Disp: 30 capsule, Rfl: 0 .  simvastatin (ZOCOR) 20 MG tablet, Take 1 tablet (20 mg total) by mouth at bedtime., Disp: 30 tablet, Rfl: 3   Review of Systems  Constitutional: Negative for appetite change, chills,  diaphoresis, fatigue, fever and unexpected weight change.  HENT: Negative for congestion, dental problem, drooling, ear pain, facial swelling, hearing loss, mouth sores, sneezing, sore throat, trouble swallowing and voice change.   Eyes: Negative for pain, discharge, redness, itching and visual disturbance.  Respiratory: Positive for shortness of breath. Negative for cough, choking and wheezing.   Cardiovascular: Positive for chest pain and leg swelling. Negative for palpitations.  Gastrointestinal: Negative for abdominal pain, blood in stool, constipation, diarrhea and vomiting.  Endocrine: Negative for cold intolerance, heat intolerance and polydipsia.  Genitourinary: Negative for decreased urine volume, dysuria and hematuria.  Musculoskeletal: Positive for back pain. Negative for arthralgias and gait problem.  Skin: Negative for rash.  Allergic/Immunologic: Negative for environmental allergies.  Neurological: Negative for seizures, syncope, light-headedness and headaches.  Hematological: Negative for adenopathy.  Psychiatric/Behavioral: Negative for agitation, dysphoric mood and suicidal ideas. The patient is not nervous/anxious.     Per HPI unless specifically indicated above     Objective:    BP 136/70 (BP Location: Right Arm, Patient Position: Sitting, Cuff Size: Large)   Pulse 90   Temp (!) 97.5 F (36.4 C)   Ht 5\' 11"  (1.803 m)   Wt (!) 376 lb 8 oz (170.8 kg)  SpO2 96%   BMI 52.51 kg/m   Wt Readings from Last 3 Encounters:  06/04/17 (!) 376 lb 8 oz (170.8 kg)  05/26/17 (!) 375 lb (170.1 kg)  04/09/17 (!) 375 lb 8 oz (170.3 kg)    Physical Exam  Constitutional: He is oriented to person, place, and time. He appears well-developed and well-nourished.  HENT:  Head: Normocephalic and atraumatic.  Neck: Neck supple.  Cardiovascular: Normal rate and regular rhythm.  Pulses:      Dorsalis pedis pulses are 1+ on the right side, and 1+ on the left side.       Posterior  tibial pulses are 1+ on the right side, and 1+ on the left side.  Pulmonary/Chest: Effort normal and breath sounds normal. He has no wheezes.  Abdominal: Soft. Bowel sounds are normal. There is no hepatosplenomegaly. There is no tenderness.  Musculoskeletal: He exhibits edema.  LLE swelled and hyperpigmented in comparison with RLE.    Lymphadenopathy:    He has no cervical adenopathy.  Neurological: He is alert and oriented to person, place, and time.  Skin: Skin is warm and dry.  Psychiatric: He has a normal mood and affect. His behavior is normal.  Vitals reviewed.    Calf circumference:  R -21 1/4 inch,    L  -23 inch Maceration webspace between 4 & 5 toes, L foot.  No erythema or signs secondary infection  EKG  - No changes comp with EKG 03/19/2017      Assessment & Plan:   Encounter Diagnoses  Name Primary?  . Leg edema, left Yes  . Essential hypertension   . Rib pain on left side   . Diabetes mellitus without complication ([place])   . BMI 50.0-59.9, adult ([place])   . Morbid obesity ([place])   . Leg swelling   . Cigarette nicotine dependence with nicotine-induced disorder   . Cough   . Tinea pedis of left foot     -counseled pt on need for Smoking cessation -will get LE doppler r/o DVT (pt has [facility]) -pt to get Labs prior to appt next week -Counseled on athletes foot including keeping his feet dry and using OTC cream.  He is given handout -consider ABI if doppler unrevealing -discussed with pt the need for weight loss and discussed that he needs to attend the DM education class which will help with nutritional education -rx promethazine dm for cough.  Pt counseled to avoid driving on this Rx due to sedation -pt to F/u next Monday as scheduled.  RTO sooner prn

## 2017-06-06 ENCOUNTER — Telehealth: Payer: Self-pay | Admitting: Student

## 2017-06-06 NOTE — Telephone Encounter (Signed)
Pt called and left voicemail on 06-06-17 in the morning stating cough medicine given to him at last visit has been helpful, but celebrex has not been working for pain. Pt wants for something else to be sent to his pharmacy. Call back number [phone]  Pt called again on 06-06-17 in the afternoon. Pt was advised that PA could write rx for diclofenac, naproxen, lidocaine patches, celebrex (any NSAIDs) and send to his pharmacy, but cannot write any narcotics (due to [facility] policy). Pt states he has been using heating pad and taking celebrex with no improvements. Pt believes there is no point in getting any NSAIDs because he will not know if they will help and doesn't want to "waste the money if it won't work". Pt states he will "tough it out" and wait until this evening to determine if to go to ER or to call the office back to notify if he would like rx for NSAIDs sent to pharmacy.

## 2017-06-07 ENCOUNTER — Ambulatory Visit (HOSPITAL_COMMUNITY): Payer: Self-pay

## 2017-06-11 ENCOUNTER — Ambulatory Visit: Payer: Self-pay | Admitting: Physician Assistant

## 2017-06-14 ENCOUNTER — Encounter: Payer: Self-pay | Admitting: Physician Assistant

## 2017-06-14 ENCOUNTER — Ambulatory Visit: Payer: Self-pay | Admitting: Physician Assistant

## 2017-06-14 VITALS — BP 138/72 | HR 76 | Temp 97.9°F | Ht 71.0 in | Wt 376.2 lb

## 2017-06-14 DIAGNOSIS — R0781 Pleurodynia: Secondary | ICD-10-CM

## 2017-06-14 DIAGNOSIS — Z6841 Body Mass Index (BMI) 40.0 and over, adult: Secondary | ICD-10-CM

## 2017-06-14 DIAGNOSIS — F17219 Nicotine dependence, cigarettes, with unspecified nicotine-induced disorders: Secondary | ICD-10-CM

## 2017-06-14 DIAGNOSIS — R600 Localized edema: Secondary | ICD-10-CM

## 2017-06-14 DIAGNOSIS — I739 Peripheral vascular disease, unspecified: Secondary | ICD-10-CM

## 2017-06-14 DIAGNOSIS — B353 Tinea pedis: Secondary | ICD-10-CM

## 2017-06-14 DIAGNOSIS — E785 Hyperlipidemia, unspecified: Secondary | ICD-10-CM

## 2017-06-14 DIAGNOSIS — I10 Essential (primary) hypertension: Secondary | ICD-10-CM

## 2017-06-14 DIAGNOSIS — E119 Type 2 diabetes mellitus without complications: Secondary | ICD-10-CM

## 2017-06-14 MED ORDER — LISINOPRIL 20 MG PO TABS: 20.0000 mg | ORAL_TABLET | Freq: Every day | ORAL | 3 refills | Status: DC

## 2017-06-14 MED ORDER — MICONAZOLE NITRATE 2 % EX CREA: 1.0000 "application " | TOPICAL_CREAM | Freq: Two times a day (BID) | CUTANEOUS | 0 refills | Status: DC

## 2017-06-14 NOTE — Progress Notes (Signed)
BP 138/72 (BP Location: Left Arm, Patient Position: Sitting, Cuff Size: Large)   Pulse 76   Temp 97.9 F (36.6 C)   Ht 5\' 11"  (1.803 m)   Wt (!) 376 lb 4 oz (170.7 kg)   SpO2 97%   BMI 52.48 kg/m    Subjective:    Patient ID: Thomas Myers, male    DOB: June 15, 1970, 47 y.o.   MRN: [id number]  HPI: Thomas Myers is a 47 y.o. male presenting on 06/14/2017 for Diabetes; Hypertension; and Hyperlipidemia   HPI   Pt has [employer] discount but not sure when it expires.   Pt states cough med helped a lot.   Pt is still smoking but says he has cut back some.   Pt was sent for LE doppler last week and it was normal.  Pt states LLE swelling has decreased some.   Pt states tinea pedis not improved and lotrimin OTC burns  Relevant past medical, surgical, family and social history reviewed and updated as indicated. Interim medical history since our last visit reviewed. Allergies and medications reviewed and updated.   Current Outpatient Medications:  .  celecoxib (CELEBREX) 200 MG capsule, Take 1 capsule (200 mg total) by mouth 2 (two) times daily as needed., Disp: 30 capsule, Rfl: 0 .  lisinopril (PRINIVIL,ZESTRIL) 10 MG tablet, TAKE 1 TABLET BY MOUTH ONCE DAILY, Disp: 30 tablet, Rfl: 1 .  metFORMIN (GLUCOPHAGE XR) 500 MG 24 hr tablet, Take 1 tablet (500 mg total) by mouth daily with breakfast., Disp: 30 tablet, Rfl: 2 .  simvastatin (ZOCOR) 20 MG tablet, Take 1 tablet (20 mg total) by mouth at bedtime., Disp: 30 tablet, Rfl: 3 .  omeprazole (PRILOSEC) 20 MG capsule, Take 1 capsule (20 mg total) by mouth daily. (Patient not taking: Reported on 06/04/2017), Disp: 30 capsule, Rfl: 0 .  promethazine-dextromethorphan (PROMETHAZINE-DM) 6.25-15 MG/5ML syrup, 1 tsp po q 4 - 6 hour prn cough (Patient not taking: Reported on 06/14/2017), Disp: 118 mL, Rfl: 0   Review of Systems  Constitutional: Negative for appetite change, chills, diaphoresis, fatigue, fever and unexpected weight change.  HENT:  Negative for congestion, dental problem, drooling, ear pain, facial swelling, hearing loss, mouth sores, sneezing, sore throat, trouble swallowing and voice change.   Eyes: Negative for pain, discharge, redness, itching and visual disturbance.  Respiratory: Negative for cough, choking, shortness of breath and wheezing.   Cardiovascular: Positive for chest pain and leg swelling. Negative for palpitations.  Gastrointestinal: Negative for abdominal pain, blood in stool, constipation, diarrhea and vomiting.  Endocrine: Negative for cold intolerance, heat intolerance and polydipsia.  Genitourinary: Negative for decreased urine volume, dysuria and hematuria.  Musculoskeletal: Negative for arthralgias, back pain and gait problem.  Skin: Negative for rash.  Allergic/Immunologic: Negative for environmental allergies.  Neurological: Negative for seizures, syncope, light-headedness and headaches.  Hematological: Negative for adenopathy.  Psychiatric/Behavioral: Negative for agitation, dysphoric mood and suicidal ideas. The patient is not nervous/anxious.     Per HPI unless specifically indicated above     Objective:    BP 138/72 (BP Location: Left Arm, Patient Position: Sitting, Cuff Size: Large)   Pulse 76   Temp 97.9 F (36.6 C)   Ht 5\' 11"  (1.803 m)   Wt (!) 376 lb 4 oz (170.7 kg)   SpO2 97%   BMI 52.48 kg/m   Wt Readings from Last 3 Encounters:  06/14/17 (!) 376 lb 4 oz (170.7 kg)  06/04/17 (!) 376 lb 8 oz (170.8  kg)  05/26/17 (!) 375 lb (170.1 kg)    Physical Exam  Constitutional: He is oriented to person, place, and time. He appears well-developed and well-nourished.  HENT:  Head: Normocephalic and atraumatic.  Neck: Neck supple.  Cardiovascular: Normal rate and regular rhythm.  Pulses:      Dorsalis pedis pulses are 1+ on the right side, and 1+ on the left side.  Pulmonary/Chest: Effort normal and breath sounds normal. He has no wheezes.  Abdominal: Soft. Bowel sounds are  normal. There is no hepatosplenomegaly. There is no tenderness.  Musculoskeletal: He exhibits edema (LLE still > than RLE but less than at OV 1 week ago).  Lymphadenopathy:    He has no cervical adenopathy.  Neurological: He is alert and oriented to person, place, and time.  Skin: Skin is warm and dry.  BLE will with hyperpigmentation, LLE > RLE Maceration L foot between 4th and 5th toes not improved, not worsened.  No redness or signs cellulitis.   Psychiatric: He has a normal mood and affect. His behavior is normal.  Vitals reviewed.   Results for orders placed or performed during the hospital encounter of 06/04/17  HgB A1c  Result Value Ref Range   Hgb A1c MFr Bld 6.5 (H) 4.8 - 5.6 %   Mean Plasma Glucose 139.85 mg/dL  Lipid panel  Result Value Ref Range   Cholesterol 142 0 - 200 mg/dL   Triglycerides 100 <150 mg/dL   HDL 33 (L) >40 mg/dL   Total CHOL/HDL Ratio 4.3 RATIO   VLDL 20 0 - 40 mg/dL   LDL Cholesterol 89 0 - 99 mg/dL  Comprehensive metabolic panel  Result Value Ref Range   Sodium 138 135 - 145 mmol/L   Potassium 4.0 3.5 - 5.1 mmol/L   Chloride 104 101 - 111 mmol/L   CO2 27 22 - 32 mmol/L   Glucose, Bld 110 (H) 65 - 99 mg/dL   BUN 12 6 - 20 mg/dL   Creatinine, Ser 0.90 0.61 - 1.24 mg/dL   Calcium 8.8 (L) 8.9 - 10.3 mg/dL   Total Protein 6.8 6.5 - 8.1 g/dL   Albumin 3.3 (L) 3.5 - 5.0 g/dL   AST 16 15 - 41 U/L   ALT 25 17 - 63 U/L   Alkaline Phosphatase 93 38 - 126 U/L   Total Bilirubin 0.3 0.3 - 1.2 mg/dL   GFR calc non Af Amer >60 >60 mL/min   GFR calc Af Amer >60 >60 mL/min   Anion gap 7 5 - 15      Assessment & Plan:   Encounter Diagnoses  Name Primary?  . Leg edema, left Yes  . PVD (peripheral vascular disease) ([place])   . Essential hypertension   . Rib pain on left side   . Diabetes mellitus without complication ([place])   . BMI 50.0-59.9, adult ([place])   . Morbid obesity ([place])   . Cigarette nicotine dependence with nicotine-induced disorder   .  Tinea pedis of left foot   . Hyperlipidemia, unspecified hyperlipidemia type    -Reviewed labs with pt -will order ABI -will refer to Nutritionist to help with weight loss.  -Counseled pt that he needs to get his weight down to help his LE.  Counseled on dietary changes (must not skip meals) and increasing exercise.  -Increase lisinopril to 20mg . Pt to continue other medications -counseled pt on smoking cessation -rx Miconazole cream for tinea and counseled pt on keeping the webspace dry -pt to follow  up 1 month.  RTO sooner prn

## 2017-06-15 ENCOUNTER — Other Ambulatory Visit: Payer: Self-pay | Admitting: Physician Assistant

## 2017-06-19 ENCOUNTER — Other Ambulatory Visit: Payer: Self-pay | Admitting: Physician Assistant

## 2017-06-25 ENCOUNTER — Encounter: Payer: Self-pay | Admitting: Physician Assistant

## 2017-06-25 ENCOUNTER — Ambulatory Visit: Payer: Self-pay | Admitting: Physician Assistant

## 2017-06-25 VITALS — BP 146/91 | HR 83 | Temp 97.0°F | Ht 71.0 in | Wt 374.8 lb

## 2017-06-25 DIAGNOSIS — J00 Acute nasopharyngitis [common cold]: Secondary | ICD-10-CM

## 2017-06-25 DIAGNOSIS — F17219 Nicotine dependence, cigarettes, with unspecified nicotine-induced disorders: Secondary | ICD-10-CM

## 2017-06-25 MED ORDER — PROMETHAZINE-DM 6.25-15 MG/5ML PO SYRP: ORAL_SOLUTION | ORAL | 0 refills | Status: DC

## 2017-06-25 NOTE — Progress Notes (Signed)
BP (!) 146/91 (BP Location: Left Arm, Patient Position: Sitting, Cuff Size: Large)   Pulse 83   Temp (!) 97 F (36.1 C) (Oral)   Ht 5\' 11"  (1.803 m)   Wt (!) 374 lb 12 oz (170 kg)   SpO2 97%   BMI 52.27 kg/m    Subjective:    Patient ID: Thomas Myers, male    DOB: 1971-05-06, 47 y.o.   MRN: [id number]  HPI: Thomas Myers is a 47 y.o. male presenting on 06/25/2017 for Cough   HPI   Pt is sick. Started about 2 days ago. Lots of coughing, running nose, chills, sore throat.   He is using no OTCs for symptoms.  He is smoking just a little bit but he says it tastes like drywall.   Relevant past medical, surgical, family and social history reviewed and updated as indicated. Interim medical history since our last visit reviewed. Allergies and medications reviewed and updated.    Current Outpatient Medications:  .  celecoxib (CELEBREX) 200 MG capsule, TAKE 1 CAPSULE (200 MG TOTAL) BY MOUTH 2 (TWO) TIMES DAILY AS NEEDED., Disp: 60 capsule, Rfl: 0 .  clotrimazole (LOTRIMIN) 1 % cream, Apply 1 application topically 2 (two) times daily., Disp: , Rfl:  .  lisinopril (PRINIVIL,ZESTRIL) 20 MG tablet, Take 1 tablet (20 mg total) by mouth daily., Disp: 30 tablet, Rfl: 3 .  metFORMIN (GLUCOPHAGE-XR) 500 MG 24 hr tablet, TAKE 1 TABLET BY MOUTH ONCE DAILY WITH BREAKFAST, Disp: 30 tablet, Rfl: 2 .  omeprazole (PRILOSEC) 20 MG capsule, Take 1 capsule (20 mg total) by mouth daily., Disp: 30 capsule, Rfl: 0 .  simvastatin (ZOCOR) 20 MG tablet, Take 1 tablet (20 mg total) by mouth at bedtime., Disp: 30 tablet, Rfl: 3 .  miconazole (MICATIN) 2 % cream, Apply 1 application topically 2 (two) times daily. (Patient not taking: Reported on 06/25/2017), Disp: 28.35 g, Rfl: 0  Review of Systems  Constitutional: Positive for chills and diaphoresis. Negative for appetite change, fatigue, fever and unexpected weight change.  HENT: Positive for congestion, sneezing, sore throat and trouble swallowing. Negative  for dental problem, drooling, ear pain, facial swelling, hearing loss, mouth sores and voice change.   Eyes: Negative for pain, discharge, redness, itching and visual disturbance.  Respiratory: Positive for cough. Negative for choking, shortness of breath and wheezing.   Cardiovascular: Negative for chest pain, palpitations and leg swelling.  Gastrointestinal: Positive for diarrhea. Negative for abdominal pain, blood in stool, constipation and vomiting.  Endocrine: Negative for cold intolerance, heat intolerance and polydipsia.  Genitourinary: Negative for decreased urine volume, dysuria and hematuria.  Musculoskeletal: Negative for arthralgias, back pain and gait problem.  Skin: Negative for rash.  Allergic/Immunologic: Negative for environmental allergies.  Neurological: Positive for headaches. Negative for seizures, syncope and light-headedness.  Hematological: Negative for adenopathy.  Psychiatric/Behavioral: Negative for agitation, dysphoric mood and suicidal ideas. The patient is not nervous/anxious.     Per HPI unless specifically indicated above     Objective:    BP (!) 146/91 (BP Location: Left Arm, Patient Position: Sitting, Cuff Size: Large)   Pulse 83   Temp (!) 97 F (36.1 C) (Oral)   Ht 5\' 11"  (1.803 m)   Wt (!) 374 lb 12 oz (170 kg)   SpO2 97%   BMI 52.27 kg/m   Wt Readings from Last 3 Encounters:  06/25/17 (!) 374 lb 12 oz (170 kg)  06/14/17 (!) 376 lb 4 oz (170.7 kg)  06/04/17 (!) 376 lb 8 oz (170.8 kg)    Physical Exam  Constitutional: He is oriented to person, place, and time. He appears well-developed and well-nourished.  HENT:  Head: Normocephalic and atraumatic.  Right Ear: Hearing, tympanic membrane, external ear and ear canal normal.  Left Ear: Hearing, tympanic membrane, external ear and ear canal normal.  Nose: Rhinorrhea present.  Mouth/Throat: Uvula is midline and oropharynx is clear and moist. No uvula swelling. No oropharyngeal exudate, posterior  oropharyngeal edema, posterior oropharyngeal erythema or tonsillar abscesses.  Neck: Neck supple.  Cardiovascular: Normal rate and regular rhythm.  Pulmonary/Chest: Effort normal and breath sounds normal.  Lymphadenopathy:    He has no cervical adenopathy.  Neurological: He is alert and oriented to person, place, and time.  Skin: Skin is warm and dry.  Psychiatric: He has a normal mood and affect. His behavior is normal.  Vitals reviewed.        Assessment & Plan:    Encounter Diagnoses  Name Primary?  . Acute nasopharyngitis Yes  . Cigarette nicotine dependence with nicotine-induced disorder     -counseled pt No smoking -rx promethazine dm.  Counseled to avoid driving on this rx due to sedation -encouraged rest, fluids, warm salt water gargles, etc -pt given handout -follow up as scheduled. RTO sooner prn

## 2017-06-25 NOTE — Patient Instructions (Signed)
Upper Respiratory Infection, Adult Most upper respiratory infections (URIs) are a viral infection of the air passages leading to the lungs. A URI affects the nose, throat, and upper air passages. The most common type of URI is nasopharyngitis and is typically referred to as "the common cold." URIs run their course and usually go away on their own. Most of the time, a URI does not require medical attention, but sometimes a bacterial infection in the upper airways can follow a viral infection. This is called a secondary infection. Sinus and middle ear infections are common types of secondary upper respiratory infections. Bacterial pneumonia can also complicate a URI. A URI can worsen asthma and chronic obstructive pulmonary disease (COPD). Sometimes, these complications can require emergency medical care and may be life threatening. What are the causes? Almost all URIs are caused by viruses. A virus is a type of germ and can spread from one person to another. What increases the risk? You may be at risk for a URI if:  You smoke.  You have chronic heart or lung disease.  You have a weakened defense (immune) system.  You are very young or very old.  You have nasal allergies or asthma.  You work in crowded or poorly ventilated areas.  You work in health care facilities or schools.  What are the signs or symptoms? Symptoms typically develop 2-3 days after you come in contact with a cold virus. Most viral URIs last 7-10 days. However, viral URIs from the influenza virus (flu virus) can last 14-18 days and are typically more severe. Symptoms may include:  Runny or stuffy (congested) nose.  Sneezing.  Cough.  Sore throat.  Headache.  Fatigue.  Fever.  Loss of appetite.  Pain in your forehead, behind your eyes, and over your cheekbones (sinus pain).  Muscle aches.  How is this diagnosed? Your health care provider may diagnose a URI by:  Physical exam.  Tests to check that your  symptoms are not due to another condition such as: ? Strep throat. ? Sinusitis. ? Pneumonia. ? Asthma.  How is this treated? A URI goes away on its own with time. It cannot be cured with medicines, but medicines may be prescribed or recommended to relieve symptoms. Medicines may help:  Reduce your fever.  Reduce your cough.  Relieve nasal congestion.  Follow these instructions at home:  Take medicines only as directed by your health care provider.  Gargle warm saltwater or take cough drops to comfort your throat as directed by your health care provider.  Use a warm mist humidifier or inhale steam from a shower to increase air moisture. This may make it easier to breathe.  Drink enough fluid to keep your urine clear or pale yellow.  Eat soups and other clear broths and maintain good nutrition.  Rest as needed.  Return to work when your temperature has returned to normal or as your health care provider advises. You may need to stay home longer to avoid infecting others. You can also use a face mask and careful hand washing to prevent spread of the virus.  Increase the usage of your inhaler if you have asthma.  Do not use any tobacco products, including cigarettes, chewing tobacco, or electronic cigarettes. If you need help quitting, ask your health care provider. How is this prevented? The best way to protect yourself from getting a cold is to practice good hygiene.  Avoid oral or hand contact with people with cold symptoms.  Wash your   hands often if contact occurs.  There is no clear evidence that vitamin C, vitamin E, echinacea, or exercise reduces the chance of developing a cold. However, it is always recommended to get plenty of rest, exercise, and practice good nutrition. Contact a health care provider if:  You are getting worse rather than better.  Your symptoms are not controlled by medicine.  You have chills.  You have worsening shortness of breath.  You have  brown or red mucus.  You have yellow or brown nasal discharge.  You have pain in your face, especially when you bend forward.  You have a fever.  You have swollen neck glands.  You have pain while swallowing.  You have white areas in the back of your throat. Get help right away if:  You have severe or persistent: ? Headache. ? Ear pain. ? Sinus pain. ? Chest pain.  You have chronic lung disease and any of the following: ? Wheezing. ? Prolonged cough. ? Coughing up blood. ? A change in your usual mucus.  You have a stiff neck.  You have changes in your: ? Vision. ? Hearing. ? Thinking. ? Mood. This information is not intended to replace advice given to you by your health care provider. Make sure you discuss any questions you have with your health care provider. Document Released: 11/08/2000 Document Revised: 01/16/2016 Document Reviewed: 08/20/2013 Elsevier Interactive Patient Education  2018 [employer]  

## 2017-07-13 ENCOUNTER — Other Ambulatory Visit: Payer: Self-pay | Admitting: Physician Assistant

## 2017-07-17 ENCOUNTER — Ambulatory Visit: Payer: Self-pay | Admitting: Physician Assistant

## 2017-07-17 ENCOUNTER — Encounter: Payer: Self-pay | Admitting: Physician Assistant

## 2017-07-17 VITALS — BP 146/84 | HR 83 | Temp 96.3°F | Ht 71.0 in | Wt 376.2 lb

## 2017-07-17 DIAGNOSIS — E119 Type 2 diabetes mellitus without complications: Secondary | ICD-10-CM

## 2017-07-17 DIAGNOSIS — I10 Essential (primary) hypertension: Secondary | ICD-10-CM

## 2017-07-17 DIAGNOSIS — Z6841 Body Mass Index (BMI) 40.0 and over, adult: Secondary | ICD-10-CM

## 2017-07-17 DIAGNOSIS — B353 Tinea pedis: Secondary | ICD-10-CM

## 2017-07-17 DIAGNOSIS — K439 Ventral hernia without obstruction or gangrene: Secondary | ICD-10-CM

## 2017-07-17 DIAGNOSIS — F17219 Nicotine dependence, cigarettes, with unspecified nicotine-induced disorders: Secondary | ICD-10-CM

## 2017-07-17 MED ORDER — SIMVASTATIN 20 MG PO TABS: 20.0000 mg | ORAL_TABLET | Freq: Every day | ORAL | 3 refills | Status: DC

## 2017-07-17 MED ORDER — ATENOLOL 50 MG PO TABS: 50.0000 mg | ORAL_TABLET | Freq: Every day | ORAL | 3 refills | Status: DC

## 2017-07-17 NOTE — Progress Notes (Signed)
BP (!) 146/84 (BP Location: Left Arm, Patient Position: Sitting, Cuff Size: Large)   Pulse 83   Temp (!) 96.3 F (35.7 C)   Ht 5\' 11"  (1.803 m)   Wt (!) 376 lb 4 oz (170.7 kg)   SpO2 95%   BMI 52.48 kg/m    Subjective:    Patient ID: Thomas Myers, male    DOB: 09/05/1970, 47 y.o.   MRN: [id number]  HPI: Thomas Myers is a 47 y.o. male presenting on 07/17/2017 for Hypertension; Edema; Tinea; and Follow-up   HPI   Pt no longer needing cough syrup.  He ran out of his simvastatin yesterday  Pt is still smoking but a lot less.  He is mostly vaping now.   Pt has appt with dietician on 07/19/17   Relevant past medical, surgical, family and social history reviewed and updated as indicated. Interim medical history since our last visit reviewed. Allergies and medications reviewed and updated.   Current Outpatient Medications:  .  celecoxib (CELEBREX) 200 MG capsule, TAKE 1 CAPSULE (200 MG TOTAL) BY MOUTH 2 (TWO) TIMES DAILY AS NEEDED., Disp: 60 capsule, Rfl: 0 .  clotrimazole (LOTRIMIN) 1 % cream, Apply 1 application topically 2 (two) times daily as needed. , Disp: , Rfl:  .  lisinopril (PRINIVIL,ZESTRIL) 20 MG tablet, Take 1 tablet (20 mg total) by mouth daily., Disp: 30 tablet, Rfl: 3 .  metFORMIN (GLUCOPHAGE-XR) 500 MG 24 hr tablet, TAKE 1 TABLET BY MOUTH ONCE DAILY WITH BREAKFAST, Disp: 30 tablet, Rfl: 2 .  omeprazole (PRILOSEC) 20 MG capsule, Take 1 capsule (20 mg total) by mouth daily., Disp: 30 capsule, Rfl: 0 .  simvastatin (ZOCOR) 20 MG tablet, TAKE 1 TABLET BY MOUTH ONCE DAILY AT BEDTIME (Patient not taking: Reported on 07/17/2017), Disp: 30 tablet, Rfl: 3   Review of Systems  Constitutional: Negative for appetite change, chills, diaphoresis, fatigue, fever and unexpected weight change.  HENT: Negative for congestion, dental problem, drooling, ear pain, facial swelling, hearing loss, mouth sores, sneezing, sore throat, trouble swallowing and voice change.   Eyes:  Negative for pain, discharge, redness, itching and visual disturbance.  Respiratory: Negative for cough, choking, shortness of breath and wheezing.   Cardiovascular: Negative for chest pain, palpitations and leg swelling.  Gastrointestinal: Negative for abdominal pain, blood in stool, constipation, diarrhea and vomiting.  Endocrine: Negative for cold intolerance, heat intolerance and polydipsia.  Genitourinary: Negative for decreased urine volume, dysuria and hematuria.  Musculoskeletal: Negative for arthralgias, back pain and gait problem.  Skin: Negative for rash.  Allergic/Immunologic: Negative for environmental allergies.  Neurological: Negative for seizures, syncope, light-headedness and headaches.  Hematological: Negative for adenopathy.  Psychiatric/Behavioral: Negative for agitation, dysphoric mood and suicidal ideas. The patient is not nervous/anxious.     Per HPI unless specifically indicated above     Objective:    BP (!) 146/84 (BP Location: Left Arm, Patient Position: Sitting, Cuff Size: Large)   Pulse 83   Temp (!) 96.3 F (35.7 C)   Ht 5\' 11"  (1.803 m)   Wt (!) 376 lb 4 oz (170.7 kg)   SpO2 95%   BMI 52.48 kg/m   Wt Readings from Last 3 Encounters:  07/17/17 (!) 376 lb 4 oz (170.7 kg)  06/25/17 (!) 374 lb 12 oz (170 kg)  06/14/17 (!) 376 lb 4 oz (170.7 kg)    Physical Exam  Constitutional: He is oriented to person, place, and time. He appears well-developed and well-nourished.  HENT:  Head: Normocephalic and atraumatic.  Neck: Neck supple.  Cardiovascular: Normal rate and regular rhythm.  Pulmonary/Chest: Effort normal and breath sounds normal. He has no wheezes.  Abdominal: Soft. Bowel sounds are normal. There is no hepatosplenomegaly. There is no tenderness. There is no rigidity. A hernia is present. Hernia confirmed positive in the ventral area.  Musculoskeletal: He exhibits no edema.       Left foot: There is normal range of motion, no tenderness, no bony  tenderness and no swelling.  Maceration L foot webspace between 4th and 5th toes.  No redness or secondary infection  Lymphadenopathy:    He has no cervical adenopathy.  Neurological: He is alert and oriented to person, place, and time.  Skin: Skin is warm and dry.  Psychiatric: He has a normal mood and affect. His behavior is normal.  Vitals reviewed.        Assessment & Plan:     Encounter Diagnoses  Name Primary?  . Essential hypertension Yes  . Tinea pedis of left foot   . BMI 50.0-59.9, adult ([place])   . Morbid obesity ([place])   . Cigarette nicotine dependence with nicotine-induced disorder   . Diabetes mellitus without complication ([place])   . Hernia of abdominal wall     -Add atenolol to current medications -Refer to surgery for abdominal hernia which significantly limits his ability to exercise and daily funtioning of life -counseled pt again on need for weight loss to minimize health risks in the future.  He has appointment with dietician.  Emphasized need for regular exercise -counseled on smoking cessation -pt to continue with athletes foot treatment- webspace is being slow to heal -pt to follow up in 1 month to recheck bp.  RTO sooner prn

## 2017-07-19 ENCOUNTER — Encounter: Payer: Self-pay | Attending: Physician Assistant | Admitting: Nutrition

## 2017-07-19 ENCOUNTER — Encounter: Payer: Self-pay | Admitting: Nutrition

## 2017-07-19 ENCOUNTER — Telehealth: Payer: Self-pay | Admitting: Nutrition

## 2017-07-19 VITALS — Ht 73.0 in | Wt 378.0 lb

## 2017-07-19 DIAGNOSIS — E1165 Type 2 diabetes mellitus with hyperglycemia: Secondary | ICD-10-CM

## 2017-07-19 DIAGNOSIS — IMO0002 Reserved for concepts with insufficient information to code with codable children: Secondary | ICD-10-CM

## 2017-07-19 DIAGNOSIS — E118 Type 2 diabetes mellitus with unspecified complications: Principal | ICD-10-CM

## 2017-07-19 NOTE — Patient Instructions (Addendum)
Goals 1. Follow My Plate 2. Don't skip meals 3. Increase fresh fruits and vegetables. 4. Exercise; walking daily. 5. Eat meals on time. Lose 1-2 lbs per week.

## 2017-07-19 NOTE — Telephone Encounter (Signed)
vm to call and reschedule missed appt.

## 2017-07-19 NOTE — Progress Notes (Signed)
  Medical Nutrition Therapy:  Appt start time: 1130 end time:  1230.  Assessment:  Primary concerns today: Diabetes and Morbid Obesity. Has a umbilicial herna and being scheduled for surgery.  He lives with his parents. He does most of the cooking. Not working due to hernia.   He notes he doesn't eat a lot. Only eats 1-2 meals per day. DIet is high in fat, salt and he doesn't like a lot of vegetables or fresh fruits.    No active due to hernia. Isn't testing his blood sugars. Doesn't have insurance or any income for covering a meter and testing supplies.. A1C 5.4%. On Metformin XR  mg once a day Current diet is excessive in calories contributing to weight gain and Type 2 DM.  Lab Results  Component Value Date   HGBA1C 6.5 (H) 06/04/2017   CMP Latest Ref Rng & Units 06/04/2017 03/17/2017 03/12/2017  Glucose 65 - 99 mg/dL 110(H) 118(H) 97  BUN 6 - 20 mg/dL 12 13 11   Creatinine 0.61 - 1.24 mg/dL 0.90 0.82 0.93  Sodium 135 - 145 mmol/L 138 139 137  Potassium 3.5 - 5.1 mmol/L 4.0 4.2 4.3  Chloride 101 - 111 mmol/L 104 104 104  CO2 22 - 32 mmol/L 27 27 27   Calcium 8.9 - 10.3 mg/dL 8.8(L) 8.4(L) 8.7(L)  Total Protein 6.5 - 8.1 g/dL 6.8 6.5 6.8  Total Bilirubin 0.3 - 1.2 mg/dL 0.3 0.4 0.5  Alkaline Phos 38 - 126 U/L 93 80 92  AST 15 - 41 U/L 16 13(L) 17  ALT 17 - 63 U/L 25 22 28     Preferred Learning Style:   No preference indicated   Learning Readiness:   Ready  Change in progress  MEDICATIONS:   DIETARY INTAKE:   24-hr recall:  B ( AM): JImmy dean biscuit 1,    Snk ( AM): L ( PM): skipped SF tea or water Snk ( PM):  D ( PM): Sausage 3-4 oz, red beans and rice 1 cup, SF Tea Snk ( PM):  Beverages: Water and SF Tea  Usual physical activity: ADL  Estimated energy needs: 1800  calories 200  g carbohydrates 135 g protein 50 g fat  Progress Towards Goal(s):  In progress.   Nutritional Diagnosis:  NB-1.1 Food and nutrition-related knowledge deficit As related to DM and  Morbid Obesity.  As evidenced by A1C 6.5% and BMI 49..    Intervention:  Nutrition and Diabetes education provided on My Plate, CHO counting, meal planning, portion sizes, timing of meals, avoiding snacks between meals unless having a low blood sugar, target ranges for A1C and blood sugars, signs/symptoms and treatment of hyper/hypoglycemia, monitoring blood sugars, taking medications as prescribed, benefits of exercising 30 minutes per day and prevention of complications of DM. Marland Kitchen Goals 1. Follow My Plate 2. Don't skip meals 3. Increase fresh fruits and vegetables. 4. Exercise; walking daily. 5. Eat meals on time. Lose 1-2 lbs per week.   Teaching Method Utilized: none Visual Auditory Hands on  Handouts given during visit include:  The Plate Method   Meal Plan Card    Barriers to learning/adherence to lifestyle change: none  Demonstrated degree of understanding via:  Teach Back   Monitoring/Evaluation:  Dietary intake, exercise, meal planning, and body weight in 1 month(s).

## 2017-07-23 ENCOUNTER — Other Ambulatory Visit: Payer: Self-pay | Admitting: Physician Assistant

## 2017-07-23 MED ORDER — CELECOXIB 200 MG PO CAPS: 200.0000 mg | ORAL_CAPSULE | Freq: Two times a day (BID) | ORAL | 0 refills | Status: DC | PRN

## 2017-08-14 ENCOUNTER — Ambulatory Visit: Payer: Self-pay | Admitting: Physician Assistant

## 2017-08-16 ENCOUNTER — Ambulatory Visit: Payer: Self-pay | Admitting: Physician Assistant

## 2017-08-20 ENCOUNTER — Encounter: Payer: Self-pay | Admitting: Physician Assistant

## 2017-08-21 ENCOUNTER — Encounter: Payer: Self-pay | Admitting: Physician Assistant

## 2017-08-21 ENCOUNTER — Encounter: Payer: Self-pay | Attending: Physician Assistant | Admitting: Nutrition

## 2017-08-21 ENCOUNTER — Ambulatory Visit: Payer: Self-pay | Admitting: Physician Assistant

## 2017-08-21 ENCOUNTER — Encounter: Payer: Self-pay | Admitting: Nutrition

## 2017-08-21 VITALS — BP 125/74 | HR 65 | Temp 97.5°F | Ht 71.0 in | Wt 372.8 lb

## 2017-08-21 DIAGNOSIS — E785 Hyperlipidemia, unspecified: Secondary | ICD-10-CM

## 2017-08-21 DIAGNOSIS — I10 Essential (primary) hypertension: Secondary | ICD-10-CM

## 2017-08-21 DIAGNOSIS — E118 Type 2 diabetes mellitus with unspecified complications: Secondary | ICD-10-CM

## 2017-08-21 DIAGNOSIS — K439 Ventral hernia without obstruction or gangrene: Secondary | ICD-10-CM

## 2017-08-21 DIAGNOSIS — E1165 Type 2 diabetes mellitus with hyperglycemia: Secondary | ICD-10-CM

## 2017-08-21 DIAGNOSIS — IMO0002 Reserved for concepts with insufficient information to code with codable children: Secondary | ICD-10-CM

## 2017-08-21 DIAGNOSIS — F419 Anxiety disorder, unspecified: Secondary | ICD-10-CM

## 2017-08-21 DIAGNOSIS — I889 Nonspecific lymphadenitis, unspecified: Secondary | ICD-10-CM

## 2017-08-21 DIAGNOSIS — E119 Type 2 diabetes mellitus without complications: Secondary | ICD-10-CM

## 2017-08-21 MED ORDER — CEPHALEXIN 500 MG PO CAPS: 500.0000 mg | ORAL_CAPSULE | Freq: Three times a day (TID) | ORAL | 0 refills | Status: DC

## 2017-08-21 NOTE — Patient Instructions (Signed)
Goals 1. Continue to eat three meals per day 2. Increase carrots, green beans and cauliflower rice 3. Increase water intake 4. Walk 15-30 minutes  A day. Lose 3-4 lbs per month

## 2017-08-21 NOTE — Progress Notes (Signed)
BP 125/74 (BP Location: Right Arm, Patient Position: Sitting, Cuff Size: Large)   Pulse 65   Temp (!) 97.5 F (36.4 C)   Ht 5\' 11"  (1.803 m)   Wt (!) 372 lb 12 oz (169.1 kg)   SpO2 96%   BMI 51.99 kg/m    Subjective:    Patient ID: Thomas Myers, male    DOB: 1970-07-06, 47 y.o.   MRN: [id number]  HPI: Thomas Myers is a 47 y.o. male presenting on 08/21/2017 for Hypertension   HPI  BP improved today  Pt hasn't heard from surgeon.  He was referred a month ago.  Pt currently has [facility].   Pt is seeing RD now for help with his weight.  He has lost 4 pounds since last month  Pt says athlete's foot is improved.    Pt states lymph node enlarged for a month, since right after his last OV.   Pt states some anxiety recently.  He says he doesn't really feel like he has stressors in his life that would be causing him to feel anxious.   He has had 5 or 6 attacks that leave him unable to return to sleep for some time (when they arouse him from sleep which happened 2 timeS).  Pt denies sob, cp.  No history of PE or dvt.   Relevant past medical, surgical, family and social history reviewed and updated as indicated. Interim medical history since our last visit reviewed. Allergies and medications reviewed and updated.  CURRENT MEDS: Atenolol 50mg  qd celebrex prn Lisinopril 20 mg qd Metformin xr 500mg  qd Simvastatin 20 mg qd  Review of Systems  Constitutional: Positive for fatigue. Negative for appetite change, chills, diaphoresis, fever and unexpected weight change.  HENT: Negative for congestion, dental problem, drooling, ear pain, facial swelling, hearing loss, mouth sores, sneezing, sore throat, trouble swallowing and voice change.   Eyes: Negative for pain, discharge, redness, itching and visual disturbance.  Respiratory: Negative for cough, choking, shortness of breath and wheezing.   Cardiovascular: Negative for chest pain, palpitations and leg swelling.    Gastrointestinal: Negative for abdominal pain, blood in stool, constipation, diarrhea and vomiting.  Endocrine: Negative for cold intolerance, heat intolerance and polydipsia.  Genitourinary: Negative for decreased urine volume, dysuria and hematuria.  Musculoskeletal: Negative for arthralgias, back pain and gait problem.  Skin: Negative for rash.  Allergic/Immunologic: Negative for environmental allergies.  Neurological: Negative for seizures, syncope, light-headedness and headaches.  Hematological: Negative for adenopathy.  Psychiatric/Behavioral: Positive for agitation. Negative for dysphoric mood and suicidal ideas. The patient is nervous/anxious.     Per HPI unless specifically indicated above     Objective:    BP 125/74 (BP Location: Right Arm, Patient Position: Sitting, Cuff Size: Large)   Pulse 65   Temp (!) 97.5 F (36.4 C)   Ht 5\' 11"  (1.803 m)   Wt (!) 372 lb 12 oz (169.1 kg)   SpO2 96%   BMI 51.99 kg/m   Wt Readings from Last 3 Encounters:  08/21/17 (!) 372 lb 12 oz (169.1 kg)  08/21/17 (!) 375 lb (170.1 kg)  07/19/17 (!) 378 lb (171.5 kg)    Physical Exam  Constitutional: He is oriented to person, place, and time. He appears well-developed and well-nourished.  HENT:  Head: Normocephalic and atraumatic.  Right Ear: Tympanic membrane, external ear and ear canal normal.  Left Ear: Tympanic membrane, external ear and ear canal normal.  Mouth/Throat: Uvula is midline and  oropharynx is clear and moist. No trismus in the jaw. No dental abscesses, uvula swelling or dental caries.  Neck: Neck supple.    Lymph node enlarged, soft nontender mobile, R jaw  Cardiovascular: Normal rate and regular rhythm.  Pulmonary/Chest: Effort normal and breath sounds normal. He has no wheezes.  Abdominal: Soft. Bowel sounds are normal. There is no hepatosplenomegaly. There is no tenderness.  Musculoskeletal: He exhibits no edema (no sweling either lower extremity).  Tinea pedis  resolved.  Maceration L foot webspace resolved.   Lymphadenopathy:    He has cervical adenopathy.  Neurological: He is alert and oriented to person, place, and time.  Skin: Skin is warm and dry.  No sores or signs infection in scalp or on face  Psychiatric: He has a normal mood and affect. His behavior is normal.  Vitals reviewed.    EKG NSR at 64 bpm. No change comp with EKG from 06/04/17      Assessment & Plan:    Encounter Diagnoses  Name Primary?  . Essential hypertension Yes  . Anxiety   . Diabetes mellitus without complication ([place])   . Lymphadenitis   . Morbid obesity ([place])   . Hernia of abdominal wall      -Pt counseled to continue good foot care to prevent return of tinea pedis -in light of no obvious source for the enlarged lymph node, will treat with keflex and recheck 1 month -pt to continue with dietician for help with his weight -nurse called surgeon's office and appointment has been scheduled for april -pt to continue current medication for blood pressure -discussed medication to help with anxiety.  Pt says he prefers to wait.  He will reconsider if episodes continue -pt to follow up 6 weeks.  RTO sooner prn

## 2017-08-21 NOTE — Progress Notes (Signed)
  Medical Nutrition Therapy:  Appt start time: 930 end time:  1000  Assessment:  Primary concerns today: Diabetes and Morbid Obesity. Lost 3 lbs.  He has been eating three meals per day.  Watching portions. Has kept a food journal. A1C 6.5%. Working on diet and weight loss to improve newly diagnosed DM. He has not had any sweets, fried foods since last visit. Keeping a food journal. Still working on adjusting to eating better balance meals.   Will start walking now with better weather.  Waiting to hear from surgeon about his hernia surgery. Making slow and steady progress. Needs more lower carb vegetables and high fiber foods. Doesn't like a lot of low carb vegetables. Taking Meformin xr daily. He mentions he has woke up in the middle of the night with what feels like a panic attack being really anxious. Advised to talk to PCP.  Lab Results  Component Value Date   HGBA1C 6.5 (H) 06/04/2017   CMP Latest Ref Rng & Units 06/04/2017 03/17/2017 03/12/2017  Glucose 65 - 99 mg/dL 110(H) 118(H) 97  BUN 6 - 20 mg/dL 12 13 11   Creatinine 0.61 - 1.24 mg/dL 0.90 0.82 0.93  Sodium 135 - 145 mmol/L 138 139 137  Potassium 3.5 - 5.1 mmol/L 4.0 4.2 4.3  Chloride 101 - 111 mmol/L 104 104 104  CO2 22 - 32 mmol/L 27 27 27   Calcium 8.9 - 10.3 mg/dL 8.8(L) 8.4(L) 8.7(L)  Total Protein 6.5 - 8.1 g/dL 6.8 6.5 6.8  Total Bilirubin 0.3 - 1.2 mg/dL 0.3 0.4 0.5  Alkaline Phos 38 - 126 U/L 93 80 92  AST 15 - 41 U/L 16 13(L) 17  ALT 17 - 63 U/L 25 22 28     Preferred Learning Style:   No preference indicated   Learning Readiness:   Ready  Change in progress  MEDICATIONS:   DIETARY INTAKE:   Has been working on three meals per day. Keeping food journal. Making slow progress.  Usual physical activity: ADL  Estimated energy needs: 1800  calories 200  g carbohydrates 135 g protein 50 g fat  Progress Towards Goal(s):  In progress.   Nutritional Diagnosis:  NB-1.1 Food and nutrition-related  knowledge deficit As related to DM and Morbid Obesity.  As evidenced by A1C 6.5% and BMI 49..    Intervention:  Nutrition and Diabetes education provided on My Plate, CHO counting, meal planning, portion sizes, timing of meals, avoiding snacks between meals unless having a low blood sugar, target ranges for A1C and blood sugars, signs/symptoms and treatment of hyper/hypoglycemia, monitoring blood sugars, taking medications as prescribed, benefits of exercising 30 minutes per day and prevention of complications of DM.   Marland KitchenGoals 1. Continue to eat three meals per day 2. Increase carrots, green beans and cauliflower rice 3. Increase water intake 4. Walk 15-30 minutes  A day. Lose 3-4 lbs per month Teaching Method Utilized: none Visual Auditory Hands on  Handouts given during visit include:  The Plate Method   Meal Plan Card    Barriers to learning/adherence to lifestyle change: none  Demonstrated degree of understanding via:  Teach Back   Monitoring/Evaluation:  Dietary intake, exercise, meal planning, and body weight in 1 month(s). He may benefit from a sleep study due to obesity and poor sleep and waking up with some anxiety or panic attacks.

## 2017-08-23 ENCOUNTER — Other Ambulatory Visit: Payer: Self-pay | Admitting: Physician Assistant

## 2017-09-04 ENCOUNTER — Encounter: Payer: Self-pay | Admitting: General Surgery

## 2017-09-04 ENCOUNTER — Ambulatory Visit: Payer: Self-pay | Admitting: General Surgery

## 2017-09-04 VITALS — BP 133/74 | HR 63 | Temp 97.7°F | Ht 73.0 in | Wt 367.0 lb

## 2017-09-04 DIAGNOSIS — K439 Ventral hernia without obstruction or gangrene: Secondary | ICD-10-CM

## 2017-09-04 NOTE — Progress Notes (Signed)
Thomas Myers; [id number]; [birth year]   HPI Patient is a 47 year old morbidly obese white male who was referred to my care by the [facility] for evaluation and treatment of a ventral hernia.  It has been present for many years, but recently seems to be more lobular in nature and causing him occasional abdominal discomfort.  He denies any nausea or vomiting.  He wears an abdominal binder to help keep the hernia contained.  He denies any fever or chills.  He currently has 0 out of 10 abdominal pain.  He has never had abdominal surgery in this region.  Made worse with straining. Past Medical History:  Diagnosis Date  . Diabetes mellitus without complication ([place])   . Hypercholesteremia 03/17/2017  . Hypertension     History reviewed. No pertinent surgical history.  Family History  Problem Relation Age of Onset  . Cancer Father        Melanoma  . Hypertension Father   . Cancer Maternal Grandmother        Lung  . Cancer Maternal Grandfather   . Diabetes Maternal Grandfather   . Cancer Paternal Grandmother   . Cancer Paternal Grandfather     Current Outpatient Medications on File Prior to Visit  Medication Sig Dispense Refill  . atenolol (TENORMIN) 50 MG tablet Take 1 tablet (50 mg total) by mouth daily. 30 tablet 3  . celecoxib (CELEBREX) 200 MG capsule TAKE 1 CAPSULE BY MOUTH 2 TIMES DAILY AS NEEDED. 60 capsule 0  . cephALEXin (KEFLEX) 500 MG capsule Take 1 capsule (500 mg total) by mouth 3 (three) times daily. 21 capsule 0  . lisinopril (PRINIVIL,ZESTRIL) 20 MG tablet Take 1 tablet (20 mg total) by mouth daily. 30 tablet 3  . metFORMIN (GLUCOPHAGE-XR) 500 MG 24 hr tablet TAKE 1 TABLET BY MOUTH ONCE DAILY WITH BREAKFAST 30 tablet 2  . simvastatin (ZOCOR) 20 MG tablet Take 1 tablet (20 mg total) by mouth at bedtime. 30 tablet 3   No current facility-administered medications on file prior to visit.     No Known Allergies  Social History   Substance and Sexual Activity  Alcohol  Use No    Social History   Tobacco Use  Smoking Status Current Every Day Smoker  . Packs/day: 0.75  . Years: 23.00  . Pack years: 17.25  . Types: Cigarettes  Smokeless Tobacco Never Used  Tobacco Comment   is using vapes to slow down cigarettes    Review of Systems  Constitutional: Positive for malaise/fatigue.  HENT: Negative.   Eyes: Negative.   Respiratory: Positive for shortness of breath.   Cardiovascular: Negative.   Gastrointestinal: Negative.   Genitourinary: Negative.   Musculoskeletal: Negative.   Endo/Heme/Allergies: Negative.   Psychiatric/Behavioral: Negative.     Objective   Vitals:   09/04/17 1449  BP: 133/74  Pulse: 63  Temp: 97.7 F (36.5 C)    Physical Exam  Constitutional: He is oriented to person, place, and time and well-developed, well-nourished, and in no distress.  HENT:  Head: Normocephalic and atraumatic.  Cardiovascular: Normal rate, regular rhythm and normal heart sounds. Exam reveals no gallop and no friction rub.  No murmur heard. Pulmonary/Chest: Effort normal and breath sounds normal. No respiratory distress. He has no wheezes. He has no rales.  Abdominal: Soft. Bowel sounds are normal. He exhibits no distension. There is no tenderness. There is no rebound.  Large body habitus.  Large midline ventral hernia extending from the umbilicus superiorly.  It measures  greater than 10 cm in length.  It is reducible.  It is lobulated in nature.  Neurological: He is alert and oriented to person, place, and time.  Skin: Skin is warm and dry.  Vitals reviewed.   Assessment  Ventral hernia Plan   Patient will call to schedule a laparoscopic ventral herniorrhaphy with mesh once he has talked to the financial office.  The risks and benefits of the procedure including bleeding, infection, recurrence, mesh use, bowel injury, and the possibility of an open procedure were fully explained to the patient, who gave informed consent.

## 2017-09-04 NOTE — Patient Instructions (Signed)
Laparoscopic Ventral Hernia Repair Laparoscopic ventral hernia repairis a procedure to fix a bulge of tissue that pushes through a weak area of muscle in the abdomen (ventral hernia). A ventral hernia may be at the belly button (umbilical), above the belly button (epigastric), or at the incision site from previous abdominal surgery (incisional hernia). You may have this procedure as emergency surgery if part of your intestine gets trapped inside the hernia and starts to lose its blood supply (strangulation). Laparoscopic surgery is done through small incisions using a thin surgical telescope with a light and camera on the end (laparoscope). During surgery, your surgeon will use images from the laparoscope to guide the procedure. A mesh screen will be placed in the hernia to close the opening and strengthen the abdominal wall. Tell a health care provider about:  Any allergies you have.  All medicines you are taking, including vitamins, herbs, eye drops, creams, and over-the-counter medicines.  Any problems you or family members have had with anesthetic medicines.  Any blood disorders you have.  Any surgeries you have had.  Any medical conditions you have.  Whether you are pregnant or may be pregnant. What are the risks? Generally, this is a safe procedure. However, problems may occur, including:  Infection.  Bleeding.  Allergic reactions to medicines.  Damage to other structures or organs in the abdomen.  Trouble urinating or having a bowel movement after surgery.  Pneumonia.  Blood clots.  The hernia coming back after surgery.  Fluid buildup in the area of the hernia.  In some cases, your health care provider may need to switch from a laparoscopic procedure to a procedure that is done through a single, larger incision in the abdomen (open procedure). You may need an open procedure if:  You have a hernia that is difficult to repair.  Your organs are hard to see.  You have  bleeding problems during the laparoscopic procedure.  What happens before the procedure? Staying hydrated Follow instructions from your health care provider about hydration, which may include:  Up to 2 hours before the procedure - you may continue to drink clear liquids, such as water, clear fruit juice, black coffee, and plain tea.  Eating and drinking restrictions Follow instructions from your health care provider about eating and drinking, which may include:  8 hours before the procedure - stop eating heavy meals or foods such as meat, fried foods, or fatty foods.  6 hours before the procedure - stop eating light meals or foods, such as toast or cereal.  6 hours before the procedure - stop drinking milk or drinks that contain milk.  2 hours before the procedure - stop drinking clear liquids.  Medicines  Ask your health care provider about: ? Changing or stopping your regular medicines. This is especially important if you are taking diabetes medicines or blood thinners. ? Taking medicines such as aspirin and ibuprofen. These medicines can thin your blood. Do not take these medicines before your procedure if your health care provider instructs you not to.  You may be given antibiotic medicine to help prevent infection. General instructions  You may be asked to take a laxative or do an enema to empty your bowel before surgery (bowel prep).  Do not use any products that contain nicotine or tobacco, such as cigarettes and e-cigarettes. If you need help quitting, ask your health care provider.  You may need to have tests before the procedure, such as: ? Blood tests. ? Urine tests. ?  Abdominal ultrasound. ? Chest X-ray. ? Electrocardiogram (ECG).  Plan to have someone take you home from the hospital or clinic.  If you will be going home right after the procedure, plan to have someone with you for 24 hours. What happens during the procedure?  To reduce your risk of  infection: ? Your health care team will wash or sanitize their hands. ? Your skin will be washed with soap.  An IV tube will be inserted into one of your veins.  You will be given one or more of the following: ? A medicine to help you relax (sedative). ? A medicine to make you fall asleep (general anesthetic).  A small incision will be made in your abdomen. A hollow metal tube (trocar) will be placed through the incision.  A tube will be placed through the trocar to inflate your abdomen with air-like gas. This makes it easier for your surgeon to see inside your abdomen and do the repair.  The laparoscope will be inserted into your abdomen through the trocar. The laparoscope will send images to a monitor in the operating room.  Other trocars will be put through other small incisions in your abdomen. The surgical instruments needed for the procedure will be placed through these trocars.  The tissue or intestines that make up the hernia will be moved back into place.  The edges of the hernia may be stitched together.  A piece of mesh will be used to close the hernia. Stitches (sutures), clips, or staples will be used to keep the mesh in place.  A bandage (dressing) or skin glue will be put over the incisions. The procedure may vary among health care providers and hospitals. What happens after the procedure?  Your blood pressure, heart rate, breathing rate, and blood oxygen level will be monitored until the medicines you were given have worn off.  You will continue to receive fluids and medicines through an IV tube. Your IV tube will be removed when you can drink clear fluids.  You will be given pain medicine as needed.  You will be encouraged to get up and walk around as soon as possible.  You may have to wear compression stockings. These stockings help to prevent blood clots and reduce swelling in your legs.  You will be shown how to do deep breathing exercises to help prevent a  lung infection.  Do not drive for 24 hours if you were given a sedative. This information is not intended to replace advice given to you by your health care provider. Make sure you discuss any questions you have with your health care provider. Document Released: 05/01/2012 Document Revised: 12/31/2015 Document Reviewed: 12/31/2015 [employer]vier Interactive Patient Education  2018 [employer]  

## 2017-09-06 NOTE — H&P (Signed)
Thomas Myers; [id number]; 1970-07-10   HPI Patient is a 47 year old morbidly obese white male who was referred to my care by the [facility] for evaluation and treatment of a ventral hernia.  It has been present for many years, but recently seems to be more lobular in nature and causing him occasional abdominal discomfort.  He denies any nausea or vomiting.  He wears an abdominal binder to help keep the hernia contained.  He denies any fever or chills.  He currently has 0 out of 10 abdominal pain.  He has never had abdominal surgery in this region.  Made worse with straining. Past Medical History:  Diagnosis Date  . Diabetes mellitus without complication ([place])   . Hypercholesteremia 03/17/2017  . Hypertension     History reviewed. No pertinent surgical history.  Family History  Problem Relation Age of Onset  . Cancer Father        Melanoma  . Hypertension Father   . Cancer Maternal Grandmother        Lung  . Cancer Maternal Grandfather   . Diabetes Maternal Grandfather   . Cancer Paternal Grandmother   . Cancer Paternal Grandfather     Current Outpatient Medications on File Prior to Visit  Medication Sig Dispense Refill  . atenolol (TENORMIN) 50 MG tablet Take 1 tablet (50 mg total) by mouth daily. 30 tablet 3  . celecoxib (CELEBREX) 200 MG capsule TAKE 1 CAPSULE BY MOUTH 2 TIMES DAILY AS NEEDED. 60 capsule 0  . cephALEXin (KEFLEX) 500 MG capsule Take 1 capsule (500 mg total) by mouth 3 (three) times daily. 21 capsule 0  . lisinopril (PRINIVIL,ZESTRIL) 20 MG tablet Take 1 tablet (20 mg total) by mouth daily. 30 tablet 3  . metFORMIN (GLUCOPHAGE-XR) 500 MG 24 hr tablet TAKE 1 TABLET BY MOUTH ONCE DAILY WITH BREAKFAST 30 tablet 2  . simvastatin (ZOCOR) 20 MG tablet Take 1 tablet (20 mg total) by mouth at bedtime. 30 tablet 3   No current facility-administered medications on file prior to visit.     No Known Allergies  Social History   Substance and Sexual Activity  Alcohol  Use No    Social History   Tobacco Use  Smoking Status Current Every Day Smoker  . Packs/day: 0.75  . Years: 23.00  . Pack years: 17.25  . Types: Cigarettes  Smokeless Tobacco Never Used  Tobacco Comment   is using vapes to slow down cigarettes    Review of Systems  Constitutional: Positive for malaise/fatigue.  HENT: Negative.   Eyes: Negative.   Respiratory: Positive for shortness of breath.   Cardiovascular: Negative.   Gastrointestinal: Negative.   Genitourinary: Negative.   Musculoskeletal: Negative.   Endo/Heme/Allergies: Negative.   Psychiatric/Behavioral: Negative.     Objective   Vitals:   09/04/17 1449  BP: 133/74  Pulse: 63  Temp: 97.7 F (36.5 C)    Physical Exam  Constitutional: He is oriented to person, place, and time and well-developed, well-nourished, and in no distress.  HENT:  Head: Normocephalic and atraumatic.  Cardiovascular: Normal rate, regular rhythm and normal heart sounds. Exam reveals no gallop and no friction rub.  No murmur heard. Pulmonary/Chest: Effort normal and breath sounds normal. No respiratory distress. He has no wheezes. He has no rales.  Abdominal: Soft. Bowel sounds are normal. He exhibits no distension. There is no tenderness. There is no rebound.  Large body habitus.  Large midline ventral hernia extending from the umbilicus superiorly.  It measures  greater than 10 cm in length.  It is reducible.  It is lobulated in nature.  Neurological: He is alert and oriented to person, place, and time.  Skin: Skin is warm and dry.  Vitals reviewed.   Assessment  Ventral hernia Plan   Patient will call to schedule a laparoscopic ventral herniorrhaphy with mesh once he has talked to the financial office.  The risks and benefits of the procedure including bleeding, infection, recurrence, mesh use, bowel injury, and the possibility of an open procedure were fully explained to the patient, who gave informed consent.

## 2017-09-10 NOTE — Patient Instructions (Signed)
Thomas Myers  09/10/2017     @PREFPERIOPPHARMACY @   Your procedure is scheduled on  09/17/2017  Report to [facility] at  900   A.M.  Call this number if you have problems the morning of surgery:  [phone]   Remember:  Do not eat food or drink liquids after midnight.  Take these medicines the morning of surgery with A SIP OF WATER  Celebrex, lisinopril.   Do not wear jewelry, make-up or nail polish.  Do not wear lotions, powders, or perfumes, or deodorant.  Do not shave 48 hours prior to surgery.  Men may shave face and neck.  Do not bring valuables to the hospital.  [facility] is not responsible for any belongings or valuables.  Contacts, dentures or bridgework may not be worn into surgery.  Leave your suitcase in the car.  After surgery it may be brought to your room.  For patients admitted to the hospital, discharge time will be determined by your treatment team.  Patients discharged the day of surgery will not be allowed to drive home.   Name and phone number of your driver:  Family Special instructions:  None  Please read over the following fact sheets that you were given. Anesthesia Post-op Instructions and Care and Recovery After Surgery       Laparoscopic Ventral Hernia Repair Laparoscopic ventral hernia repairis a procedure to fix a bulge of tissue that pushes through a weak area of muscle in the abdomen (ventral hernia). A ventral hernia may be at the belly button (umbilical), above the belly button (epigastric), or at the incision site from previous abdominal surgery (incisional hernia). You may have this procedure as emergency surgery if part of your intestine gets trapped inside the hernia and starts to lose its blood supply (strangulation). Laparoscopic surgery is done through small incisions using a thin surgical telescope with a light and camera on the end (laparoscope). During surgery, your surgeon will use images from the laparoscope to  guide the procedure. A mesh screen will be placed in the hernia to close the opening and strengthen the abdominal wall. Tell a health care provider about:  Any allergies you have.  All medicines you are taking, including vitamins, herbs, eye drops, creams, and over-the-counter medicines.  Any problems you or family members have had with anesthetic medicines.  Any blood disorders you have.  Any surgeries you have had.  Any medical conditions you have.  Whether you are pregnant or may be pregnant. What are the risks? Generally, this is a safe procedure. However, problems may occur, including:  Infection.  Bleeding.  Allergic reactions to medicines.  Damage to other structures or organs in the abdomen.  Trouble urinating or having a bowel movement after surgery.  Pneumonia.  Blood clots.  The hernia coming back after surgery.  Fluid buildup in the area of the hernia.  In some cases, your health care provider may need to switch from a laparoscopic procedure to a procedure that is done through a single, larger incision in the abdomen (open procedure). You may need an open procedure if:  You have a hernia that is difficult to repair.  Your organs are hard to see.  You have bleeding problems during the laparoscopic procedure.  What happens before the procedure? Staying hydrated Follow instructions from your health care provider about hydration, which may include:  Up to 2 hours before the procedure - you may continue  to drink clear liquids, such as water, clear fruit juice, black coffee, and plain tea.  Eating and drinking restrictions Follow instructions from your health care provider about eating and drinking, which may include:  8 hours before the procedure - stop eating heavy meals or foods such as meat, fried foods, or fatty foods.  6 hours before the procedure - stop eating light meals or foods, such as toast or cereal.  6 hours before the procedure - stop  drinking milk or drinks that contain milk.  2 hours before the procedure - stop drinking clear liquids.  Medicines  Ask your health care provider about: ? Changing or stopping your regular medicines. This is especially important if you are taking diabetes medicines or blood thinners. ? Taking medicines such as aspirin and ibuprofen. These medicines can thin your blood. Do not take these medicines before your procedure if your health care provider instructs you not to.  You may be given antibiotic medicine to help prevent infection. General instructions  You may be asked to take a laxative or do an enema to empty your bowel before surgery (bowel prep).  Do not use any products that contain nicotine or tobacco, such as cigarettes and e-cigarettes. If you need help quitting, ask your health care provider.  You may need to have tests before the procedure, such as: ? Blood tests. ? Urine tests. ? Abdominal ultrasound. ? Chest X-ray. ? Electrocardiogram (ECG).  Plan to have someone take you home from the hospital or clinic.  If you will be going home right after the procedure, plan to have someone with you for 24 hours. What happens during the procedure?  To reduce your risk of infection: ? Your health care team will wash or sanitize their hands. ? Your skin will be washed with soap.  An IV tube will be inserted into one of your veins.  You will be given one or more of the following: ? A medicine to help you relax (sedative). ? A medicine to make you fall asleep (general anesthetic).  A small incision will be made in your abdomen. A hollow metal tube (trocar) will be placed through the incision.  A tube will be placed through the trocar to inflate your abdomen with air-like gas. This makes it easier for your surgeon to see inside your abdomen and do the repair.  The laparoscope will be inserted into your abdomen through the trocar. The laparoscope will send images to a monitor in  the operating room.  Other trocars will be put through other small incisions in your abdomen. The surgical instruments needed for the procedure will be placed through these trocars.  The tissue or intestines that make up the hernia will be moved back into place.  The edges of the hernia may be stitched together.  A piece of mesh will be used to close the hernia. Stitches (sutures), clips, or staples will be used to keep the mesh in place.  A bandage (dressing) or skin glue will be put over the incisions. The procedure may vary among health care providers and hospitals. What happens after the procedure?  Your blood pressure, heart rate, breathing rate, and blood oxygen level will be monitored until the medicines you were given have worn off.  You will continue to receive fluids and medicines through an IV tube. Your IV tube will be removed when you can drink clear fluids.  You will be given pain medicine as needed.  You will be encouraged to  get up and walk around as soon as possible.  You may have to wear compression stockings. These stockings help to prevent blood clots and reduce swelling in your legs.  You will be shown how to do deep breathing exercises to help prevent a lung infection.  Do not drive for 24 hours if you were given a sedative. This information is not intended to replace advice given to you by your health care provider. Make sure you discuss any questions you have with your health care provider. Document Released: 05/01/2012 Document Revised: 12/31/2015 Document Reviewed: 12/31/2015 Elsevier Interactive Patient Education  2018 [employer]  Laparoscopic Ventral Hernia Repair, Care After This sheet gives you information about how to care for yourself after your procedure. Your health care provider may also give you more specific instructions. If you have problems or questions, contact your health care provider. What can I expect after the procedure? After the  procedure, it is common to have:  Pain, discomfort, or soreness.  Follow these instructions at home: Incision care  Follow instructions from your health care provider about how to take care of your incision. Make sure you: ? Wash your hands with soap and water before you change your bandage (dressing) or before you touch your abdomen. If soap and water are not available, use hand sanitizer. ? Change your dressing as told by your health care provider. ? Leave stitches (sutures), skin glue, or adhesive strips in place. These skin closures may need to stay in place for 2 weeks or longer. If adhesive strip edges start to loosen and curl up, you may trim the loose edges. Do not remove adhesive strips completely unless your health care provider tells you to do that.  Check your incision area every day for signs of infection. Check for: ? Redness, swelling, or pain. ? Fluid or blood. ? Warmth. ? Pus or a bad smell. Bathing  Do not take baths, swim, or use a hot tub until your health care provider approves. Ask your health care provider if you can take showers. You may only be allowed to take sponge baths for bathing.  Keep your bandage (dressing) dry until your health care provider says it can be removed. Activity  Do not lift anything that is heavier than 10 lb (4.5 kg) until your health care provider approves.  Do not drive or use heavy machinery while taking prescription pain medicine. Ask your health care provider when it is safe for you to drive or use heavy machinery.  Do not drive for 24 hours if you were given a medicine to help you relax (sedative) during your procedure.  Rest as told by your health care provider. You may return to your normal activities when your health care provider approves. General instructions  Take over-the-counter and prescription medicines only as told by your health care provider.  To prevent or treat constipation while you are taking prescription pain  medicine, your health care provider may recommend that you: ? Take over-the-counter or prescription medicines. ? Eat foods that are high in fiber, such as fresh fruits and vegetables, whole grains, and beans. ? Limit foods that are high in fat and processed sugars, such as fried and sweet foods.  Drink enough fluid to keep your urine clear or pale yellow.  Hold a pillow over your abdomen when you cough or sneeze. This helps with pain.  Keep all follow-up visits as told by your health care provider. This is important. Contact a health care provider  if:  You have: ? A fever or chills. ? Redness, swelling, or pain around your incision. ? Fluid or blood coming from your incision. ? Pus or a bad smell coming from your incision. ? Pain that gets worse or does not get better with medicine. ? Nausea or vomiting. ? A cough. ? Shortness of breath.  Your incision feels warm to the touch.  You have not had a bowel movement in three days.  You are not able to urinate. Get help right away if:  You have severe pain in your abdomen.  You have persistent nausea and vomiting.  You have redness, warmth, or pain in your leg.  You have chest pain.  You have trouble breathing. Summary  After this procedure, it is common to have pain, discomfort, or soreness.  Follow instructions from your health care provider about how to take care of your incision.  Check your incision area every day for signs of infection. Report any signs of infection to your health care provider.  Keep all follow-up visits as told by your health care provider. This is important. This information is not intended to replace advice given to you by your health care provider. Make sure you discuss any questions you have with your health care provider. Document Released: 05/01/2012 Document Revised: 01/05/2016 Document Reviewed: 01/05/2016 Elsevier Interactive Patient Education  2018 [place] Anesthesia,  Adult General anesthesia is the use of medicines to make a person "go to sleep" (be unconscious) for a medical procedure. General anesthesia is often recommended when a procedure:  Is long.  Requires you to be still or in an unusual position.  Is major and can cause you to lose blood.  Is impossible to do without general anesthesia.  The medicines used for general anesthesia are called general anesthetics. In addition to making you sleep, the medicines:  Prevent pain.  Control your blood pressure.  Relax your muscles.  Tell a health care provider about:  Any allergies you have.  All medicines you are taking, including vitamins, herbs, eye drops, creams, and over-the-counter medicines.  Any problems you or family members have had with anesthetic medicines.  Types of anesthetics you have had in the past.  Any bleeding disorders you have.  Any surgeries you have had.  Any medical conditions you have.  Any history of heart or lung conditions, such as heart failure, sleep apnea, or chronic obstructive pulmonary disease (COPD).  Whether you are pregnant or may be pregnant.  Whether you use tobacco, alcohol, marijuana, or street drugs.  Any history of Armed forces logistics/support/administrative officer.  Any history of depression or anxiety. What are the risks? Generally, this is a safe procedure. However, problems may occur, including:  Allergic reaction to anesthetics.  Lung and heart problems.  Inhaling food or liquids from your stomach into your lungs (aspiration).  Injury to nerves.  Waking up during your procedure and being unable to move (rare).  Extreme agitation or a state of mental confusion (delirium) when you wake up from the anesthetic.  Air in the bloodstream, which can lead to stroke.  These problems are more likely to develop if you are having a major surgery or if you have an advanced medical condition. You can prevent some of these complications by answering all of your health  care provider's questions thoroughly and by following all pre-procedure instructions. General anesthesia can cause side effects, including:  Nausea or vomiting  A sore throat from the breathing tube.  Feeling cold  or shivery.  Feeling tired, washed out, or achy.  Sleepiness or drowsiness.  Confusion or agitation.  What happens before the procedure? Staying hydrated Follow instructions from your health care provider about hydration, which may include:  Up to 2 hours before the procedure - you may continue to drink clear liquids, such as water, clear fruit juice, black coffee, and plain tea.  Eating and drinking restrictions Follow instructions from your health care provider about eating and drinking, which may include:  8 hours before the procedure - stop eating heavy meals or foods such as meat, fried foods, or fatty foods.  6 hours before the procedure - stop eating light meals or foods, such as toast or cereal.  6 hours before the procedure - stop drinking milk or drinks that contain milk.  2 hours before the procedure - stop drinking clear liquids.  Medicines  Ask your health care provider about: ? Changing or stopping your regular medicines. This is especially important if you are taking diabetes medicines or blood thinners. ? Taking medicines such as aspirin and ibuprofen. These medicines can thin your blood. Do not take these medicines before your procedure if your health care provider instructs you not to. ? Taking new dietary supplements or medicines. Do not take these during the week before your procedure unless your health care provider approves them.  If you are told to take a medicine or to continue taking a medicine on the day of the procedure, take the medicine with sips of water. General instructions   Ask if you will be going home the same day, the following day, or after a longer hospital stay. ? Plan to have someone take you home. ? Plan to have someone  stay with you for the first 24 hours after you leave the hospital or clinic.  For 3-6 weeks before the procedure, try not to use any tobacco products, such as cigarettes, chewing tobacco, and e-cigarettes.  You may brush your teeth on the morning of the procedure, but make sure to spit out the toothpaste. What happens during the procedure?  You will be given anesthetics through a mask and through an IV tube in one of your veins.  You may receive medicine to help you relax (sedative).  As soon as you are asleep, a breathing tube may be used to help you breathe.  An anesthesia specialist will stay with you throughout the procedure. He or she will help keep you comfortable and safe by continuing to give you medicines and adjusting the amount of medicine that you get. He or she will also watch your blood pressure, pulse, and oxygen levels to make sure that the anesthetics do not cause any problems.  If a breathing tube was used to help you breathe, it will be removed before you wake up. The procedure may vary among health care providers and hospitals. What happens after the procedure?  You will wake up, often slowly, after the procedure is complete, usually in a recovery area.  Your blood pressure, heart rate, breathing rate, and blood oxygen level will be monitored until the medicines you were given have worn off.  You may be given medicine to help you calm down if you feel anxious or agitated.  If you will be going home the same day, your health care provider may check to make sure you can stand, drink, and urinate.  Your health care providers will treat your pain and side effects before you go home.  Do not  drive for 24 hours if you received a sedative.  You may: ? Feel nauseous and vomit. ? Have a sore throat. ? Have mental slowness. ? Feel cold or shivery. ? Feel sleepy. ? Feel tired. ? Feel sore or achy, even in parts of your body where you did not have surgery. This  information is not intended to replace advice given to you by your health care provider. Make sure you discuss any questions you have with your health care provider. Document Released: 08/22/2007 Document Revised: 10/26/2015 Document Reviewed: 04/29/2015 Elsevier Interactive Patient Education  2018 [place] Anesthesia, Adult, Care After These instructions provide you with information about caring for yourself after your procedure. Your health care provider may also give you more specific instructions. Your treatment has been planned according to current medical practices, but problems sometimes occur. Call your health care provider if you have any problems or questions after your procedure. What can I expect after the procedure? After the procedure, it is common to have:  Vomiting.  A sore throat.  Mental slowness.  It is common to feel:  Nauseous.  Cold or shivery.  Sleepy.  Tired.  Sore or achy, even in parts of your body where you did not have surgery.  Follow these instructions at home: For at least 24 hours after the procedure:  Do not: ? Participate in activities where you could fall or become injured. ? Drive. ? Use heavy machinery. ? Drink alcohol. ? Take sleeping pills or medicines that cause drowsiness. ? Make important decisions or sign legal documents. ? Take care of children on your own.  Rest. Eating and drinking  If you vomit, drink water, juice, or soup when you can drink without vomiting.  Drink enough fluid to keep your urine clear or pale yellow.  Make sure you have little or no nausea before eating solid foods.  Follow the diet recommended by your health care provider. General instructions  Have a responsible adult stay with you until you are awake and alert.  Return to your normal activities as told by your health care provider. Ask your health care provider what activities are safe for you.  Take over-the-counter and  prescription medicines only as told by your health care provider.  If you smoke, do not smoke without supervision.  Keep all follow-up visits as told by your health care provider. This is important. Contact a health care provider if:  You continue to have nausea or vomiting at home, and medicines are not helpful.  You cannot drink fluids or start eating again.  You cannot urinate after 8-12 hours.  You develop a skin rash.  You have fever.  You have increasing redness at the site of your procedure. Get help right away if:  You have difficulty breathing.  You have chest pain.  You have unexpected bleeding.  You feel that you are having a life-threatening or urgent problem. This information is not intended to replace advice given to you by your health care provider. Make sure you discuss any questions you have with your health care provider. Document Released: 08/21/2000 Document Revised: 10/18/2015 Document Reviewed: 04/29/2015 Elsevier Interactive Patient Education  [employer].

## 2017-09-11 ENCOUNTER — Encounter (HOSPITAL_COMMUNITY)
Admission: RE | Admit: 2017-09-11 | Discharge: 2017-09-11 | Disposition: A | Discharge status: Home / Self Care | Payer: Self-pay | Source: Ambulatory Visit | Attending: General Surgery | Admitting: General Surgery

## 2017-09-11 ENCOUNTER — Encounter (HOSPITAL_COMMUNITY): Payer: Self-pay

## 2017-09-11 ENCOUNTER — Other Ambulatory Visit: Payer: Self-pay

## 2017-09-11 DIAGNOSIS — Z01810 Encounter for preprocedural cardiovascular examination: Secondary | ICD-10-CM | POA: Insufficient documentation

## 2017-09-11 DIAGNOSIS — Z01812 Encounter for preprocedural laboratory examination: Secondary | ICD-10-CM | POA: Insufficient documentation

## 2017-09-11 LAB — CBC WITH DIFFERENTIAL/PLATELET
BASOS ABS: 0 10*3/uL (ref 0.0–0.1)
BASOS PCT: 1 %
EOS PCT: 2 %
Eosinophils Absolute: 0.2 10*3/uL (ref 0.0–0.7)
HEMATOCRIT: 47.8 % (ref 39.0–52.0)
Hemoglobin: 15.3 g/dL (ref 13.0–17.0)
LYMPHS PCT: 24 %
Lymphs Abs: 1.9 10*3/uL (ref 0.7–4.0)
MCH: 31 pg (ref 26.0–34.0)
MCHC: 32 g/dL (ref 30.0–36.0)
MCV: 96.8 fL (ref 78.0–100.0)
Monocytes Absolute: 0.7 10*3/uL (ref 0.1–1.0)
Monocytes Relative: 8 %
NEUTROS ABS: 5.2 10*3/uL (ref 1.7–7.7)
Neutrophils Relative %: 65 %
PLATELETS: 149 10*3/uL — AB (ref 150–400)
RBC: 4.94 MIL/uL (ref 4.22–5.81)
RDW: 13.9 % (ref 11.5–15.5)
WBC: 8 10*3/uL (ref 4.0–10.5)

## 2017-09-11 LAB — BASIC METABOLIC PANEL
ANION GAP: 11 (ref 5–15)
BUN: 10 mg/dL (ref 6–20)
CO2: 24 mmol/L (ref 22–32)
Calcium: 8.5 mg/dL — ABNORMAL LOW (ref 8.9–10.3)
Chloride: 102 mmol/L (ref 101–111)
Creatinine, Ser: 0.8 mg/dL (ref 0.61–1.24)
GLUCOSE: 117 mg/dL — AB (ref 65–99)
POTASSIUM: 3.9 mmol/L (ref 3.5–5.1)
Sodium: 137 mmol/L (ref 135–145)

## 2017-09-11 LAB — HEMOGLOBIN A1C
HEMOGLOBIN A1C: 6.1 % — AB (ref 4.8–5.6)
MEAN PLASMA GLUCOSE: 128.37 mg/dL

## 2017-09-11 LAB — GLUCOSE, CAPILLARY: Glucose-Capillary: 110 mg/dL — ABNORMAL HIGH (ref 65–99)

## 2017-09-11 NOTE — Progress Notes (Signed)
   09/11/17 0826  OBSTRUCTIVE SLEEP APNEA  Have you ever been diagnosed with sleep apnea through a sleep study? No  Do you snore loudly (loud enough to be heard through closed doors)?  0  Do you often feel tired, fatigued, or sleepy during the daytime (such as falling asleep during driving or talking to someone)? 0  Has anyone observed you stop breathing during your sleep? 0  Do you have, or are you being treated for high blood pressure? 1  BMI more than 35 kg/m2? 1  Age > 50 (1-yes) 0  Neck circumference greater than:Male 16 inches or larger, Male 17inches or larger? 1  Male Gender (Yes=1) 1  Obstructive Sleep Apnea Score 4  Score 5 or greater  Results sent to PCP

## 2017-09-15 ENCOUNTER — Other Ambulatory Visit: Payer: Self-pay | Admitting: Physician Assistant

## 2017-09-17 ENCOUNTER — Ambulatory Visit (HOSPITAL_COMMUNITY)
Admission: RE | Admit: 2017-09-17 | Discharge: 2017-09-17 | Disposition: A | Discharge status: Home / Self Care | Payer: Self-pay | Source: Ambulatory Visit | Attending: General Surgery | Admitting: General Surgery

## 2017-09-17 ENCOUNTER — Ambulatory Visit (HOSPITAL_COMMUNITY): Payer: Self-pay | Admitting: Anesthesiology

## 2017-09-17 ENCOUNTER — Encounter (HOSPITAL_COMMUNITY)
Admission: RE | Disposition: A | Discharge status: Home / Self Care | Payer: Self-pay | Source: Ambulatory Visit | Attending: General Surgery

## 2017-09-17 ENCOUNTER — Encounter (HOSPITAL_COMMUNITY): Payer: Self-pay

## 2017-09-17 DIAGNOSIS — E7800 Pure hypercholesterolemia, unspecified: Secondary | ICD-10-CM | POA: Insufficient documentation

## 2017-09-17 DIAGNOSIS — G4730 Sleep apnea, unspecified: Secondary | ICD-10-CM | POA: Insufficient documentation

## 2017-09-17 DIAGNOSIS — Z7984 Long term (current) use of oral hypoglycemic drugs: Secondary | ICD-10-CM | POA: Insufficient documentation

## 2017-09-17 DIAGNOSIS — F17210 Nicotine dependence, cigarettes, uncomplicated: Secondary | ICD-10-CM | POA: Insufficient documentation

## 2017-09-17 DIAGNOSIS — Z6841 Body Mass Index (BMI) 40.0 and over, adult: Secondary | ICD-10-CM | POA: Insufficient documentation

## 2017-09-17 DIAGNOSIS — Z79899 Other long term (current) drug therapy: Secondary | ICD-10-CM | POA: Insufficient documentation

## 2017-09-17 DIAGNOSIS — I10 Essential (primary) hypertension: Secondary | ICD-10-CM | POA: Insufficient documentation

## 2017-09-17 DIAGNOSIS — E119 Type 2 diabetes mellitus without complications: Secondary | ICD-10-CM | POA: Insufficient documentation

## 2017-09-17 DIAGNOSIS — K439 Ventral hernia without obstruction or gangrene: Secondary | ICD-10-CM

## 2017-09-17 HISTORY — PX: VENTRAL HERNIA REPAIR: SHX424

## 2017-09-17 LAB — GLUCOSE, CAPILLARY
Glucose-Capillary: 112 mg/dL — ABNORMAL HIGH (ref 65–99)
Glucose-Capillary: 127 mg/dL — ABNORMAL HIGH (ref 65–99)

## 2017-09-17 SURGERY — REPAIR, HERNIA, VENTRAL, LAPAROSCOPIC
Anesthesia: General | Site: Abdomen

## 2017-09-17 MED ORDER — HYDROMORPHONE HCL 1 MG/ML IJ SOLN
0.2500 mg | INTRAMUSCULAR | Status: DC | PRN
  Administered 2017-09-17: 0.5 mg via INTRAVENOUS
  Administered 2017-09-17 (×2): 0.25 mg via INTRAVENOUS
  Filled 2017-09-17 (×2): qty 0.5

## 2017-09-17 MED ORDER — LACTATED RINGERS IV SOLN
INTRAVENOUS | Status: DC
  Administered 2017-09-17: 10:00:00 via INTRAVENOUS

## 2017-09-17 MED ORDER — POVIDONE-IODINE 10 % EX OINT
TOPICAL_OINTMENT | CUTANEOUS | Status: AC
  Filled 2017-09-17: qty 1

## 2017-09-17 MED ORDER — CEFAZOLIN SODIUM-DEXTROSE 1-4 GM/50ML-% IV SOLN
INTRAVENOUS | Status: AC
  Filled 2017-09-17: qty 50

## 2017-09-17 MED ORDER — SUGAMMADEX SODIUM 500 MG/5ML IV SOLN
INTRAVENOUS | Status: AC
  Filled 2017-09-17: qty 10

## 2017-09-17 MED ORDER — DEXTROSE 5 % IV SOLN
3.0000 g | INTRAVENOUS | Status: AC
  Administered 2017-09-17: 3 g via INTRAVENOUS

## 2017-09-17 MED ORDER — CHLORHEXIDINE GLUCONATE CLOTH 2 % EX PADS: 6.0000 | MEDICATED_PAD | Freq: Once | CUTANEOUS | Status: DC

## 2017-09-17 MED ORDER — BUPIVACAINE LIPOSOME 1.3 % IJ SUSP
INTRAMUSCULAR | Status: AC
  Filled 2017-09-17: qty 20

## 2017-09-17 MED ORDER — SUCCINYLCHOLINE CHLORIDE 20 MG/ML IJ SOLN
INTRAMUSCULAR | Status: DC | PRN
  Administered 2017-09-17: 140 mg via INTRAVENOUS

## 2017-09-17 MED ORDER — HYDROCODONE-ACETAMINOPHEN 7.5-325 MG PO TABS: 1.0000 | ORAL_TABLET | Freq: Once | ORAL | Status: DC | PRN

## 2017-09-17 MED ORDER — SUFENTANIL CITRATE 50 MCG/ML IV SOLN
INTRAVENOUS | Status: DC | PRN
  Administered 2017-09-17: 10 ug via INTRAVENOUS
  Administered 2017-09-17: 5 ug via INTRAVENOUS
  Administered 2017-09-17: 10 ug via INTRAVENOUS
  Administered 2017-09-17: 5 ug via INTRAVENOUS

## 2017-09-17 MED ORDER — ONDANSETRON HCL 4 MG/2ML IJ SOLN: 4.0000 mg | Freq: Once | INTRAMUSCULAR | Status: DC | PRN

## 2017-09-17 MED ORDER — SUCCINYLCHOLINE CHLORIDE 20 MG/ML IJ SOLN
INTRAMUSCULAR | Status: AC
  Filled 2017-09-17: qty 1

## 2017-09-17 MED ORDER — SUFENTANIL CITRATE 50 MCG/ML IV SOLN
INTRAVENOUS | Status: AC
  Filled 2017-09-17: qty 1

## 2017-09-17 MED ORDER — FENTANYL CITRATE (PF) 250 MCG/5ML IJ SOLN
INTRAMUSCULAR | Status: AC
  Filled 2017-09-17: qty 5

## 2017-09-17 MED ORDER — BUPIVACAINE LIPOSOME 1.3 % IJ SUSP
INTRAMUSCULAR | Status: DC | PRN
  Administered 2017-09-17: 20 mL

## 2017-09-17 MED ORDER — CEFAZOLIN SODIUM-DEXTROSE 2-4 GM/100ML-% IV SOLN
INTRAVENOUS | Status: AC
  Filled 2017-09-17: qty 100

## 2017-09-17 MED ORDER — MEPERIDINE HCL 50 MG/ML IJ SOLN: 6.2500 mg | INTRAMUSCULAR | Status: DC | PRN

## 2017-09-17 MED ORDER — KETOROLAC TROMETHAMINE 30 MG/ML IJ SOLN
INTRAMUSCULAR | Status: AC
  Filled 2017-09-17: qty 1

## 2017-09-17 MED ORDER — ONDANSETRON HCL 4 MG/2ML IJ SOLN
INTRAMUSCULAR | Status: DC | PRN
  Administered 2017-09-17: 4 mg via INTRAVENOUS

## 2017-09-17 MED ORDER — SODIUM CHLORIDE 0.9 % IJ SOLN
INTRAMUSCULAR | Status: AC
  Filled 2017-09-17: qty 10

## 2017-09-17 MED ORDER — KETOROLAC TROMETHAMINE 30 MG/ML IJ SOLN: 30.0000 mg | Freq: Once | INTRAMUSCULAR | Status: DC | PRN

## 2017-09-17 MED ORDER — CHLORHEXIDINE GLUCONATE CLOTH 2 % EX PADS
6.0000 | MEDICATED_PAD | Freq: Once | CUTANEOUS | Status: AC
  Administered 2017-09-17: 6 via TOPICAL

## 2017-09-17 MED ORDER — HYDROCODONE-ACETAMINOPHEN 7.5-325 MG PO TABS: 1.0000 | ORAL_TABLET | Freq: Four times a day (QID) | ORAL | 0 refills | Status: DC | PRN

## 2017-09-17 MED ORDER — KETOROLAC TROMETHAMINE 30 MG/ML IJ SOLN
30.0000 mg | Freq: Once | INTRAMUSCULAR | Status: AC
  Administered 2017-09-17: 30 mg via INTRAVENOUS

## 2017-09-17 MED ORDER — PROPOFOL 10 MG/ML IV BOLUS
INTRAVENOUS | Status: AC
  Filled 2017-09-17: qty 20

## 2017-09-17 MED ORDER — LACTATED RINGERS IV SOLN: INTRAVENOUS | Status: DC

## 2017-09-17 MED ORDER — ROCURONIUM BROMIDE 50 MG/5ML IV SOLN
INTRAVENOUS | Status: AC
  Filled 2017-09-17: qty 1

## 2017-09-17 MED ORDER — SUGAMMADEX SODIUM 500 MG/5ML IV SOLN
INTRAVENOUS | Status: DC | PRN
  Administered 2017-09-17: 500 mg via INTRAVENOUS

## 2017-09-17 MED ORDER — ROCURONIUM BROMIDE 100 MG/10ML IV SOLN
INTRAVENOUS | Status: DC | PRN
  Administered 2017-09-17: 10 mg via INTRAVENOUS
  Administered 2017-09-17: 45 mg via INTRAVENOUS
  Administered 2017-09-17: 5 mg via INTRAVENOUS
  Administered 2017-09-17: 10 mg via INTRAVENOUS

## 2017-09-17 MED ORDER — 0.9 % SODIUM CHLORIDE (POUR BTL) OPTIME
TOPICAL | Status: DC | PRN
  Administered 2017-09-17: 1000 mL

## 2017-09-17 MED ORDER — PROPOFOL 10 MG/ML IV BOLUS
INTRAVENOUS | Status: DC | PRN
  Administered 2017-09-17: 100 mg via INTRAVENOUS
  Administered 2017-09-17: 200 mg via INTRAVENOUS
  Administered 2017-09-17: 50 mg via INTRAVENOUS

## 2017-09-17 MED ORDER — PROPOFOL 10 MG/ML IV BOLUS
INTRAVENOUS | Status: AC
Start: 2017-09-17 — End: ?
  Filled 2017-09-17: qty 40

## 2017-09-17 MED ORDER — ENOXAPARIN SODIUM 40 MG/0.4ML ~~LOC~~ SOLN
40.0000 mg | Freq: Once | SUBCUTANEOUS | Status: AC
  Administered 2017-09-17: 40 mg via SUBCUTANEOUS
  Filled 2017-09-17: qty 0.4

## 2017-09-17 SURGICAL SUPPLY — 47 items
BAG HAMPER (MISCELLANEOUS) ×3 IMPLANT
BANDAGE STRIP 1X3 FLEXIBLE (GAUZE/BANDAGES/DRESSINGS) ×15 IMPLANT
BINDER ABDOMINAL  9 SM 30-45 (SOFTGOODS) ×2
BINDER ABDOMINAL 9 SM 30-45 (SOFTGOODS) ×1 IMPLANT
CLOSURE WOUND 1/2 X4 (GAUZE/BANDAGES/DRESSINGS) ×1
CLOTH BEACON ORANGE TIMEOUT ST (SAFETY) ×3 IMPLANT
COVER LIGHT HANDLE STERIS (MISCELLANEOUS) ×6 IMPLANT
DECANTER SPIKE VIAL GLASS SM (MISCELLANEOUS) ×3 IMPLANT
DEVICE TROCAR PUNCTURE CLOSURE (ENDOMECHANICALS) ×3 IMPLANT
DURAPREP 26ML APPLICATOR (WOUND CARE) ×3 IMPLANT
ELECT REM PT RETURN 9FT ADLT (ELECTROSURGICAL) ×3
ELECTRODE REM PT RTRN 9FT ADLT (ELECTROSURGICAL) ×1 IMPLANT
FILTER SMOKE EVAC LAPAROSHD (FILTER) ×3 IMPLANT
GLOVE BIOGEL PI IND STRL 7.0 (GLOVE) ×1 IMPLANT
GLOVE BIOGEL PI INDICATOR 7.0 (GLOVE) ×2
GLOVE SURG SS PI 7.5 STRL IVOR (GLOVE) ×6 IMPLANT
GOWN STRL REUS W/TWL LRG LVL3 (GOWN DISPOSABLE) ×9 IMPLANT
INST SET LAPROSCOPIC AP (KITS) ×3 IMPLANT
IV NS IRRIG 3000ML ARTHROMATIC (IV SOLUTION) IMPLANT
KIT TURNOVER KIT A (KITS) ×3 IMPLANT
LIGASURE LAP ATLAS 10MM 37CM (INSTRUMENTS) ×3 IMPLANT
MANIFOLD NEPTUNE II (INSTRUMENTS) ×3 IMPLANT
MESH VENTRALIGHT ST 6X8 (Mesh Specialty) ×2 IMPLANT
MESH VENTRLGHT ELLIPSE 8X6XMFL (Mesh Specialty) ×1 IMPLANT
NEEDLE INSUFFLATION 14GA 120MM (NEEDLE) ×3 IMPLANT
NS IRRIG 1000ML POUR BTL (IV SOLUTION) ×3 IMPLANT
PACK LAP CHOLE LZT030E (CUSTOM PROCEDURE TRAY) ×3 IMPLANT
PAD ARMBOARD 7.5X6 YLW CONV (MISCELLANEOUS) ×3 IMPLANT
PENCIL HANDSWITCHING (ELECTRODE) ×3 IMPLANT
SET BASIN LINEN APH (SET/KITS/TRAYS/PACK) ×3 IMPLANT
SET TUBE IRRIG SUCTION NO TIP (IRRIGATION / IRRIGATOR) IMPLANT
SPONGE GAUZE 2X2 8PLY STER LF (GAUZE/BANDAGES/DRESSINGS) ×4
SPONGE GAUZE 2X2 8PLY STRL LF (GAUZE/BANDAGES/DRESSINGS) ×8 IMPLANT
STAPLER VISISTAT (STAPLE) ×3 IMPLANT
STRIP CLOSURE SKIN 1/2X4 (GAUZE/BANDAGES/DRESSINGS) ×2 IMPLANT
SUT MNCRL AB 4-0 PS2 18 (SUTURE) ×3 IMPLANT
SUT NOVA NAB GS-22 2 2-0 T-19 (SUTURE) IMPLANT
SUT PROLENE 2 0 CR (SUTURE) ×3 IMPLANT
SUT VICRYL 0 UR6 27IN ABS (SUTURE) IMPLANT
TACKER 5MM HERNIA 3.5CML NAB (ENDOMECHANICALS) ×3 IMPLANT
TOWEL OR 17X26 4PK STRL BLUE (TOWEL DISPOSABLE) ×3 IMPLANT
TRAY FOLEY CATH SILVER 16FR (SET/KITS/TRAYS/PACK) ×3 IMPLANT
TROCAR ENDO BLADELESS 11MM (ENDOMECHANICALS) ×3 IMPLANT
TROCAR XCEL UNIV SLVE 11M 100M (ENDOMECHANICALS) ×9 IMPLANT
TUBING HI FLO HEAT INSUFFLATOR (IRRIGATION / IRRIGATOR) ×3 IMPLANT
WARMER LAPAROSCOPE (MISCELLANEOUS) ×3 IMPLANT
YANKAUER SUCT BULB TIP 10FT TU (MISCELLANEOUS) ×3 IMPLANT

## 2017-09-17 NOTE — Op Note (Signed)
Patient:  Thomas Myers  DOB:  12-27-1970  MRN:  [id number]   Preop Diagnosis: Ventral hernia  Postop Diagnosis: Same  Procedure: Laparoscopic ventral herniorrhaphy with mesh  Surgeon: Aviva Signs, MD  Assistant: Blake Divine, MD  Anes: General endotracheal  Indications: Patient is a 47 year old morbidly obese white male who presents with a ventral hernia in the periumbilical region.  The risks and benefits of the procedure including bleeding, infection, mesh use, bowel injury, and the possibility of an open procedure were fully explained to the patient, who gave informed consent.  Procedure note: The patient was placed in supine position.  After induction of general endotracheal anesthesia, the abdomen was prepped and draped using the usual sterile technique with DuraPrep.  Surgical site confirmation was performed.  The Veress needle was inserted in the epigastric region.  Confirmation of placement was done using the saline drop test.  The abdomen was then insufflated to 16 mmHg pressure.  An 11 mm trocar was placed in the left upper quadrant region under direct visualization without difficulty.  The liver was inspected and noted to be within normal limits.  Additional 11 mm trochars were placed under direct visualization in the right upper quadrant, right lower quadrant, and left lower quadrant regions.  The hernia defect was then inspected.  There was some omentum that was attached to the hernia sac in this region.  This was reduced and any bleeding controlled using the LigaSure.  The resultant defect measured only 3-4 cm in its greatest diameter of the umbilicus.  The hernia defect was then addressed using a 15 x 20 cm Bard ventralight mesh.  0 Prolene sutures were used in 4 quadrants to fixate the mesh to the abdominal wall.  The remainder of the mesh was circumferentially attached using the pro-tacker in a 2 layer fashion.  Good overlay was noted around the hernia defect.  No  abnormal bleeding was noted at the end of the procedure.  All air was then evacuated from the abdominal cavity prior to removal of the trochars.  All wounds were injected with Exparel.  All wounds were closed with staples.  Betadine ointment and dry sterile dressings were applied.  All tape and needle counts were correct at the end of the procedure.  The patient was extubated in the operating room and transferred to PACU in stable condition.  Complications: None  EBL: Minimal  Specimen: None

## 2017-09-17 NOTE — Discharge Instructions (Signed)
Laparoscopic Ventral Hernia Repair, Care After This sheet gives you information about how to care for yourself after your procedure. Your health care provider may also give you more specific instructions. If you have problems or questions, contact your health care provider. What can I expect after the procedure? After the procedure, it is common to have:  Pain, discomfort, or soreness.  Follow these instructions at home: Incision care  Follow instructions from your health care provider about how to take care of your incision. Make sure you: ? Wash your hands with soap and water before you change your bandage (dressing) or before you touch your abdomen. If soap and water are not available, use hand sanitizer. ? Change your dressing as told by your health care provider. ? Leave stitches (sutures), skin glue, or adhesive strips in place. These skin closures may need to stay in place for 2 weeks or longer. If adhesive strip edges start to loosen and curl up, you may trim the loose edges. Do not remove adhesive strips completely unless your health care provider tells you to do that.  Check your incision area every day for signs of infection. Check for: ? Redness, swelling, or pain. ? Fluid or blood. ? Warmth. ? Pus or a bad smell. Bathing  Do not take baths, swim, or use a hot tub until your health care provider approves. Ask your health care provider if you can take showers. You may only be allowed to take sponge baths for bathing.  Keep your bandage (dressing) dry until your health care provider says it can be removed. Activity  Do not lift anything that is heavier than 10 lb (4.5 kg) until your health care provider approves.  Do not drive or use heavy machinery while taking prescription pain medicine. Ask your health care provider when it is safe for you to drive or use heavy machinery.  Do not drive for 24 hours if you were given a medicine to help you relax (sedative) during your  procedure.  Rest as told by your health care provider. You may return to your normal activities when your health care provider approves. General instructions  Take over-the-counter and prescription medicines only as told by your health care provider.  To prevent or treat constipation while you are taking prescription pain medicine, your health care provider may recommend that you: ? Take over-the-counter or prescription medicines. ? Eat foods that are high in fiber, such as fresh fruits and vegetables, whole grains, and beans. ? Limit foods that are high in fat and processed sugars, such as fried and sweet foods.  Drink enough fluid to keep your urine clear or pale yellow.  Hold a pillow over your abdomen when you cough or sneeze. This helps with pain.  Keep all follow-up visits as told by your health care provider. This is important. Contact a health care provider if:  You have: ? A fever or chills. ? Redness, swelling, or pain around your incision. ? Fluid or blood coming from your incision. ? Pus or a bad smell coming from your incision. ? Pain that gets worse or does not get better with medicine. ? Nausea or vomiting. ? A cough. ? Shortness of breath.  Your incision feels warm to the touch.  You have not had a bowel movement in three days.  You are not able to urinate. Get help right away if:  You have severe pain in your abdomen.  You have persistent nausea and vomiting.  You have redness,  warmth, or pain in your leg.  You have chest pain.  You have trouble breathing. Summary  After this procedure, it is common to have pain, discomfort, or soreness.  Follow instructions from your health care provider about how to take care of your incision.  Check your incision area every day for signs of infection. Report any signs of infection to your health care provider.  Keep all follow-up visits as told by your health care provider. This is important. This information  is not intended to replace advice given to you by your health care provider. Make sure you discuss any questions you have with your health care provider. Document Released: 05/01/2012 Document Revised: 01/05/2016 Document Reviewed: 01/05/2016 Elsevier Interactive Patient Education  2018 [employer].

## 2017-09-17 NOTE — Interval H&P Note (Signed)
History and Physical Interval Note:  09/17/2017 10:06 AM  Thomas Myers  has presented today for surgery, with the diagnosis of ventral hernia  The various methods of treatment have been discussed with the patient and family. After consideration of risks, benefits and other options for treatment, the patient has consented to  Procedure(s): [place] (N/A) as a surgical intervention .  The patient's history has been reviewed, patient examined, no change in status, stable for surgery.  I have reviewed the patient's chart and labs.  Questions were answered to the patient's satisfaction.     Aviva Signs

## 2017-09-17 NOTE — Anesthesia Preprocedure Evaluation (Signed)
Anesthesia Evaluation  Patient identified by MRN, date of birth, ID band Patient awake    Reviewed: Allergy & Precautions, H&P , NPO status , Patient's Chart, lab work & pertinent test results, reviewed documented beta blocker date and time   Airway Mallampati: II  TM Distance: >3 FB Neck ROM: full    Dental no notable dental hx.    Pulmonary neg pulmonary ROS, sleep apnea , Current Smoker,  Probable OSA   Pulmonary exam normal breath sounds clear to auscultation       Cardiovascular Exercise Tolerance: Good hypertension, negative cardio ROS   Rhythm:regular Rate:Normal     Neuro/Psych negative neurological ROS  negative psych ROS   GI/Hepatic negative GI ROS, Neg liver ROS,   Endo/Other  negative endocrine ROSdiabetes, Type 2  Renal/GU negative Renal ROS  negative genitourinary   Musculoskeletal   Abdominal   Peds  Hematology negative hematology ROS (+)   Anesthesia Other Findings BMI 50.0-59.9, adult (HCC) Morbid obesity (HCC)    Reproductive/Obstetrics negative OB ROS                             Anesthesia Physical Anesthesia Plan  ASA: III  Anesthesia Plan: General   Post-op Pain Management:    Induction:   PONV Risk Score and Plan:   Airway Management Planned:   Additional Equipment:   Intra-op Plan:   Post-operative Plan:   Informed Consent: I have reviewed the patients History and Physical, chart, labs and discussed the procedure including the risks, benefits and alternatives for the proposed anesthesia with the patient or authorized representative who has indicated his/her understanding and acceptance.   Dental Advisory Given  Plan Discussed with: CRNA  Anesthesia Plan Comments:         Anesthesia Quick Evaluation

## 2017-09-17 NOTE — Anesthesia Postprocedure Evaluation (Signed)
Anesthesia Post Note  Patient: Thomas Myers  Procedure(s) Performed: LAPAROSCOPIC VENTRAL HERNIORRAPHY WITH MESH (N/A Abdomen)  Patient location during evaluation: Short Stay Anesthesia Type: General Level of consciousness: awake and alert and patient cooperative Pain management: satisfactory to patient Vital Signs Assessment: post-procedure vital signs reviewed and stable Respiratory status: spontaneous breathing Cardiovascular status: stable Postop Assessment: no apparent nausea or vomiting Anesthetic complications: no     Last Vitals:  Vitals:   09/17/17 1315 09/17/17 1330  BP: 116/66   Pulse: 68 63  Resp: 11 11  Temp:    SpO2: 91% (!) 86%    Last Pain:  Vitals:   09/17/17 1315  TempSrc:   PainSc: 4                  Michi Herrmann

## 2017-09-17 NOTE — Anesthesia Procedure Notes (Signed)
Procedure Name: Intubation Date/Time: 09/17/2017 11:02 AM Performed by: Vista Deck, CRNA Pre-anesthesia Checklist: Patient identified, Patient being monitored, Timeout performed, Emergency Drugs available and Suction available Patient Re-evaluated:Patient Re-evaluated prior to induction Oxygen Delivery Method: Circle System Utilized Preoxygenation: Pre-oxygenation with 100% oxygen Induction Type: IV induction Laryngoscope Size: Mac and 4 Grade View: Grade I Tube type: Oral Tube size: 7.0 mm Number of attempts: 1 Airway Equipment and Method: stylet and Oral airway Placement Confirmation: ETT inserted through vocal cords under direct vision,  positive ETCO2 and breath sounds checked- equal and bilateral Secured at: 23 cm Tube secured with: Tape Dental Injury: Teeth and Oropharynx as per pre-operative assessment

## 2017-09-17 NOTE — Transfer of Care (Signed)
Immediate Anesthesia Transfer of Care Note  Patient: Thomas Myers  Procedure(s) Performed: LAPAROSCOPIC VENTRAL HERNIORRAPHY WITH MESH (N/A Abdomen)  Patient Location: PACU  Anesthesia Type:General  Level of Consciousness: awake, alert  and patient cooperative  Airway & Oxygen Therapy: Patient Spontanous Breathing and non-rebreather face mask  Post-op Assessment: Report given to RN and Post -op Vital signs reviewed and stable  Post vital signs: Reviewed and stable  Last Vitals:  Vitals Value Taken Time  BP    Temp    Pulse    Resp    SpO2      Last Pain:  Vitals:   09/17/17 0944  TempSrc: Oral  PainSc: 0-No pain         Complications: No apparent anesthesia complications

## 2017-09-18 ENCOUNTER — Encounter (HOSPITAL_COMMUNITY): Payer: Self-pay | Admitting: General Surgery

## 2017-09-19 ENCOUNTER — Ambulatory Visit: Payer: Self-pay | Admitting: Nutrition

## 2017-09-26 ENCOUNTER — Other Ambulatory Visit: Payer: Self-pay | Admitting: General Surgery

## 2017-09-26 MED ORDER — HYDROCODONE-ACETAMINOPHEN 7.5-325 MG PO TABS: 1.0000 | ORAL_TABLET | Freq: Four times a day (QID) | ORAL | 0 refills | Status: DC | PRN

## 2017-10-02 ENCOUNTER — Other Ambulatory Visit (HOSPITAL_COMMUNITY)
Admission: RE | Admit: 2017-10-02 | Discharge: 2017-10-02 | Disposition: A | Discharge status: Home / Self Care | Payer: Self-pay | Source: Ambulatory Visit | Attending: Physician Assistant | Admitting: Physician Assistant

## 2017-10-02 ENCOUNTER — Encounter: Payer: Self-pay | Admitting: General Surgery

## 2017-10-02 ENCOUNTER — Ambulatory Visit (INDEPENDENT_AMBULATORY_CARE_PROVIDER_SITE_OTHER): Payer: Self-pay | Admitting: General Surgery

## 2017-10-02 VITALS — BP 126/66 | HR 61 | Temp 98.2°F | Wt 361.0 lb

## 2017-10-02 DIAGNOSIS — E785 Hyperlipidemia, unspecified: Secondary | ICD-10-CM | POA: Insufficient documentation

## 2017-10-02 DIAGNOSIS — E119 Type 2 diabetes mellitus without complications: Secondary | ICD-10-CM | POA: Insufficient documentation

## 2017-10-02 DIAGNOSIS — Z09 Encounter for follow-up examination after completed treatment for conditions other than malignant neoplasm: Secondary | ICD-10-CM

## 2017-10-02 LAB — LIPID PANEL
CHOL/HDL RATIO: 4.6 ratio
Cholesterol: 130 mg/dL (ref 0–200)
HDL: 28 mg/dL — AB (ref >40)
LDL CALC: 86 mg/dL (ref 0–99)
Triglycerides: 80 mg/dL (ref <150)
VLDL: 16 mg/dL (ref 0–40)

## 2017-10-02 NOTE — Progress Notes (Signed)
Subjective:     Thomas Myers  S/p laparoscopic ventral herniorrhaphy with mesh.  Doing well.  Is only taking one pain pill at night.  No nausea, vomiting, fevers.  Minimal incisional pain. Objective:    BP 126/66 (BP Location: Left Arm, Patient Position: Sitting, Cuff Size: Large)   Pulse 61   Temp 98.2 F (36.8 C)   Wt (!) 361 lb (163.7 kg)   BMI 47.63 kg/m   General:  alert, cooperative and no distress  Abdomen soft, incisions healing well.  Staples removed, steristrips applied. 50cc aspirated from hernia site.     Assessment:    Doing well postoperatively.    Plan:   Continue wearing binder during the day.  Follow up here in two weeks.

## 2017-10-03 ENCOUNTER — Ambulatory Visit: Payer: Self-pay | Admitting: Physician Assistant

## 2017-10-03 ENCOUNTER — Encounter: Payer: Self-pay | Admitting: Physician Assistant

## 2017-10-03 VITALS — BP 143/89 | HR 60 | Temp 97.3°F | Wt 362.0 lb

## 2017-10-03 DIAGNOSIS — Z9889 Other specified postprocedural states: Secondary | ICD-10-CM

## 2017-10-03 DIAGNOSIS — E785 Hyperlipidemia, unspecified: Secondary | ICD-10-CM

## 2017-10-03 DIAGNOSIS — I10 Essential (primary) hypertension: Secondary | ICD-10-CM

## 2017-10-03 DIAGNOSIS — E119 Type 2 diabetes mellitus without complications: Secondary | ICD-10-CM

## 2017-10-03 DIAGNOSIS — Z8719 Personal history of other diseases of the digestive system: Secondary | ICD-10-CM

## 2017-10-03 DIAGNOSIS — M2740 Unspecified cyst of jaw: Secondary | ICD-10-CM

## 2017-10-03 NOTE — Progress Notes (Signed)
BP (!) 143/89 (BP Location: Right Wrist, Patient Position: Sitting, Cuff Size: Normal)   Pulse 60   Temp (!) 97.3 F (36.3 C)   Wt (!) 362 lb (164.2 kg)   SpO2 96%   BMI 47.76 kg/m    Subjective:    Patient ID: Thomas Myers, male    DOB: 02-10-1971, 47 y.o.   MRN: [id number]  HPI: Thomas Myers is a 47 y.o. male presenting on 10/03/2017 for Follow-up   HPI   Pt is s/p abdominal hernia repair.  He saw dr Arnoldo Morale yesterday for follow up and has appointment to see him again in 2 weeks.   Pt says he is eating and moving his bowels and is not having any problems with his blood sugars.    Pt finished keflex given for lymph node under R jaw.  He says it has not gotten any smaller.  It does not hurt or bother him in any way.   Pt says his abdomen hurts where he had the surgery.   Relevant past medical, surgical, family and social history reviewed and updated as indicated. Interim medical history since our last visit reviewed. Allergies and medications reviewed and updated.   Current Outpatient Medications:  .  atenolol (TENORMIN) 50 MG tablet, Take 1 tablet (50 mg total) by mouth daily. (Patient taking differently: Take 50 mg by mouth every evening. ), Disp: 30 tablet, Rfl: 3 .  Camphor-Eucalyptus-Menthol (VICKS VAPORUB EX), Place 1 application into both nostrils at bedtime as needed (allergies)., Disp: , Rfl:  .  celecoxib (CELEBREX) 200 MG capsule, TAKE 1 CAPSULE BY MOUTH 2 TIMES DAILY AS NEEDED., Disp: 60 capsule, Rfl: 0 .  HYDROcodone-acetaminophen (NORCO) 7.5-325 MG tablet, Take 1 tablet by mouth every 6 (six) hours as needed for moderate pain., Disp: 25 tablet, Rfl: 0 .  lisinopril (PRINIVIL,ZESTRIL) 20 MG tablet, Take 1 tablet (20 mg total) by mouth daily., Disp: 30 tablet, Rfl: 3 .  metFORMIN (GLUCOPHAGE-XR) 500 MG 24 hr tablet, TAKE 1 TABLET BY MOUTH ONCE DAILY WITH BREAKFAST, Disp: 30 tablet, Rfl: 2 .  simvastatin (ZOCOR) 20 MG tablet, Take 1 tablet (20 mg total) by mouth  at bedtime., Disp: 30 tablet, Rfl: 3   Review of Systems  Constitutional: Positive for fatigue. Negative for appetite change, chills, diaphoresis, fever and unexpected weight change.  HENT: Negative for congestion, dental problem, drooling, ear pain, facial swelling, hearing loss, mouth sores, sneezing, sore throat, trouble swallowing and voice change.   Eyes: Negative for pain, discharge, redness, itching and visual disturbance.  Respiratory: Negative for cough, choking, shortness of breath and wheezing.   Cardiovascular: Negative for chest pain, palpitations and leg swelling.  Gastrointestinal: Positive for abdominal pain. Negative for blood in stool, constipation, diarrhea and vomiting.  Endocrine: Negative for cold intolerance, heat intolerance and polydipsia.  Genitourinary: Negative for decreased urine volume, dysuria and hematuria.  Musculoskeletal: Negative for arthralgias, back pain and gait problem.  Skin: Negative for rash.  Allergic/Immunologic: Negative for environmental allergies.  Neurological: Negative for seizures, syncope, light-headedness and headaches.  Hematological: Negative for adenopathy.  Psychiatric/Behavioral: Negative for agitation, dysphoric mood and suicidal ideas. The patient is not nervous/anxious.     Per HPI unless specifically indicated above     Objective:    BP (!) 143/89 (BP Location: Right Wrist, Patient Position: Sitting, Cuff Size: Normal)   Pulse 60   Temp (!) 97.3 F (36.3 C)   Wt (!) 362 lb (164.2 kg)   SpO2 96%  BMI 47.76 kg/m   Wt Readings from Last 3 Encounters:  10/03/17 (!) 362 lb (164.2 kg)  10/02/17 (!) 361 lb (163.7 kg)  09/11/17 (!) 373 lb 6.4 oz (169.4 kg)    Physical Exam  Constitutional: He is oriented to person, place, and time. He appears well-developed and well-nourished.  HENT:  Head: Normocephalic and atraumatic.  Bump below R mandibular angle feels like a cyst, about 1 cim in size, soft, freely mobile and  nontender  Neck: Neck supple.  Cardiovascular: Normal rate and regular rhythm.  Pulmonary/Chest: Effort normal and breath sounds normal. He has no wheezes.  Abdominal: Soft. Bowel sounds are normal. There is no hepatosplenomegaly. There is tenderness.  Musculoskeletal: He exhibits no edema.  Lymphadenopathy:    He has no cervical adenopathy.  Neurological: He is alert and oriented to person, place, and time.  Skin: Skin is warm and dry.  Psychiatric: He has a normal mood and affect. His behavior is normal.  Vitals reviewed.    Results for orders placed or performed during the hospital encounter of 10/02/17  Lipid panel  Result Value Ref Range   Cholesterol 130 0 - 200 mg/dL   Triglycerides 80 <150 mg/dL   HDL 28 (L) >40 mg/dL   Total CHOL/HDL Ratio 4.6 RATIO   VLDL 16 0 - 40 mg/dL   LDL Cholesterol 86 0 - 99 mg/dL      Assessment & Plan:   Encounter Diagnoses  Name Primary?  . Essential hypertension Yes  . Diabetes mellitus without complication ([place])   . Hyperlipidemia, unspecified hyperlipidemia type   . Morbid obesity ([place])   . Cyst, jaw   . S/P hernia repair     -reviewed labs with pt -pt to continue current medications -discussed cyst with pt.  He is in agreement with waiting for further evaluation until his abdomen starts to hurt less.  He is told to contact office if it changes- starts hurting, gets larger, etc. -pt to continue with Dr Arnoldo Morale per his recommendation -pt is given information for DM eye exam later this month -pt to follow up in  3 months.  RTO sooner prn

## 2017-10-03 NOTE — Patient Instructions (Addendum)
[facility] [place], [place] [address] [phone]  Wed. Oct 17, 2017 9:20 AM

## 2017-10-09 ENCOUNTER — Other Ambulatory Visit: Payer: Self-pay | Admitting: Physician Assistant

## 2017-10-10 ENCOUNTER — Other Ambulatory Visit: Payer: Self-pay | Admitting: Physician Assistant

## 2017-10-10 ENCOUNTER — Encounter: Payer: Self-pay | Attending: Physician Assistant | Admitting: Nutrition

## 2017-10-10 ENCOUNTER — Encounter: Payer: Self-pay | Admitting: Nutrition

## 2017-10-10 DIAGNOSIS — IMO0002 Reserved for concepts with insufficient information to code with codable children: Secondary | ICD-10-CM

## 2017-10-10 DIAGNOSIS — E1165 Type 2 diabetes mellitus with hyperglycemia: Secondary | ICD-10-CM

## 2017-10-10 DIAGNOSIS — E118 Type 2 diabetes mellitus with unspecified complications: Secondary | ICD-10-CM

## 2017-10-10 NOTE — Progress Notes (Signed)
  Medical Nutrition Therapy:  Appt start time: 930 end time:  1000  Assessment:  Primary concerns today: Diabetes and Morbid Obesity. Lost another 3 lbs for a total of 14 lbs.  Had abdominal surgery early May 2019. Healing process is slow. Wears a abdominal binder.  He has been eating three meals per day.  Watching portions and eating more fresh foods and cut out processed and fast foods and fried foods..   A1c Down to 6.1% from 6.5%.Has been doing a little light walking but fully healed from his surgery yet.  Complains of tightness and fluid in lower abdomen. He notes they drew off some fluid last visit. Sees. Dr. Arnoldo Morale in a week or so. Feels much better. Still having issues with a little pain and sleeping at night. Making progress with lifestyle choices.   Lab Results  Component Value Date   HGBA1C 6.1 (H) 09/11/2017   CMP Latest Ref Rng & Units 09/11/2017 06/04/2017 03/17/2017  Glucose 65 - 99 mg/dL 117(H) 110(H) 118(H)  BUN 6 - 20 mg/dL 10 12 13   Creatinine 0.61 - 1.24 mg/dL 0.80 0.90 0.82  Sodium 135 - 145 mmol/L 137 138 139  Potassium 3.5 - 5.1 mmol/L 3.9 4.0 4.2  Chloride 101 - 111 mmol/L 102 104 104  CO2 22 - 32 mmol/L 24 27 27   Calcium 8.9 - 10.3 mg/dL 8.5(L) 8.8(L) 8.4(L)  Total Protein 6.5 - 8.1 g/dL - 6.8 6.5  Total Bilirubin 0.3 - 1.2 mg/dL - 0.3 0.4  Alkaline Phos 38 - 126 U/L - 93 80  AST 15 - 41 U/L - 16 13(L)  ALT 17 - 63 U/L - 25 22   Lab Results  Component Value Date   HGBA1C 6.1 (H) 09/11/2017      Preferred Learning Style:   No preference indicated   Learning Readiness:   Ready  Change in progress  MEDICATIONS:   DIETARY INTAKE:   Has been working on three meals per day. Keeping food journal. Making slow progress.  Usual physical activity: ADL  Estimated energy needs: 1800  calories 200  g carbohydrates 135 g protein 50 g fat  Progress Towards Goal(s):  In progress.   Nutritional Diagnosis:  NB-1.1 Food and nutrition-related knowledge  deficit As related to DM and Morbid Obesity.  As evidenced by A1C 6.5% and BMI 49..    Intervention:  Nutrition and Diabetes education provided on My Plate, CHO counting, meal planning, portion sizes, timing of meals, avoiding snacks between meals unless having a low blood sugar, target ranges for A1C and blood sugars, signs/symptoms and treatment of hyper/hypoglycemia, monitoring blood sugars, taking medications as prescribed, benefits of exercising 30 minutes per day and prevention of complications of DM.   Goals 1. Keep portion control going. 2. Increase fresh fluids of water and fresh and vegetables. 3. Lose 3-4 lbs per month. 4. Get A1C to 5.7%.  Teaching Method Utilized: none Visual Auditory Hands on  Handouts given during visit include:  The Plate Method   Meal Plan Card    Barriers to learning/adherence to lifestyle change: none  Demonstrated degree of understanding via:  Teach Back   Monitoring/Evaluation:  Dietary intake, exercise, meal planning, and body weight in 1 month(s). He may benefit from a sleep study due to obesity and poor sleep and waking up with some anxiety or panic attacks.

## 2017-10-10 NOTE — Patient Instructions (Addendum)
Goals 1. Keep portion control going. 2. Increase fresh fluids of water and fresh and vegetables. 3. Lose 3-4 lbs per month. 4. Get A1C to 5.7%.

## 2017-10-16 ENCOUNTER — Ambulatory Visit (INDEPENDENT_AMBULATORY_CARE_PROVIDER_SITE_OTHER): Payer: Self-pay | Admitting: General Surgery

## 2017-10-16 ENCOUNTER — Ambulatory Visit: Payer: Self-pay | Admitting: General Surgery

## 2017-10-16 ENCOUNTER — Encounter: Payer: Self-pay | Admitting: General Surgery

## 2017-10-16 VITALS — BP 129/72 | HR 60 | Temp 97.1°F | Resp 20 | Wt 356.0 lb

## 2017-10-16 DIAGNOSIS — Z09 Encounter for follow-up examination after completed treatment for conditions other than malignant neoplasm: Secondary | ICD-10-CM

## 2017-10-16 NOTE — Progress Notes (Signed)
Subjective:     Thomas Myers  Patient states he is doing better.  Still has some swelling at the former hernia site.  He is not having significant abdominal pain or incisional pain. Objective:    BP 129/72 (BP Location: Left Arm, Patient Position: Sitting, Cuff Size: Large)   Pulse 60   Temp (!) 97.1 F (36.2 C) (Temporal)   Resp 20   Wt (!) 356 lb (161.5 kg)   BMI 46.97 kg/m   General:  alert, cooperative and no distress  Abdomen soft, incisions well-healed.  20 cc sanguinous seroma aspirated from former hernia site.     Assessment:    Doing well postoperatively.    Plan:   I told patient that the seroma will resolve with time.  He was instructed to follow-up with me as needed.  He is pleased with the results.

## 2017-10-31 ENCOUNTER — Encounter: Payer: Self-pay | Admitting: Physician Assistant

## 2017-10-31 ENCOUNTER — Ambulatory Visit: Payer: Self-pay | Admitting: Physician Assistant

## 2017-10-31 VITALS — BP 106/62 | HR 53 | Temp 97.7°F | Ht 71.0 in | Wt 357.8 lb

## 2017-10-31 DIAGNOSIS — R5381 Other malaise: Secondary | ICD-10-CM

## 2017-10-31 DIAGNOSIS — R350 Frequency of micturition: Secondary | ICD-10-CM

## 2017-10-31 DIAGNOSIS — R351 Nocturia: Secondary | ICD-10-CM

## 2017-10-31 LAB — POCT URINALYSIS DIPSTICK
BILIRUBIN UA: NEGATIVE
Glucose, UA: NEGATIVE
KETONES UA: NEGATIVE
LEUKOCYTES UA: NEGATIVE
Nitrite, UA: NEGATIVE
Protein, UA: POSITIVE — AB
RBC UA: NEGATIVE
SPEC GRAV UA: 1.015 (ref 1.010–1.025)
Urobilinogen, UA: 0.2 E.U./dL
pH, UA: 6.5 (ref 5.0–8.0)

## 2017-10-31 NOTE — Progress Notes (Signed)
BP 106/62 (BP Location: Left Arm, Patient Position: Sitting, Cuff Size: Normal)   Pulse (!) 53   Temp 97.7 F (36.5 C)   Ht 5\' 11"  (1.803 m)   Wt (!) 357 lb 12 oz (162.3 kg)   SpO2 95%   BMI 49.90 kg/m    Subjective:    Patient ID: Thomas Myers, male    DOB: 01/26/1971, 47 y.o.   MRN: [id number]  HPI: Thomas Myers is a 47 y.o. male presenting on 10/31/2017 for Urinary Frequency (at night time. pt states he wakes up during the night 2-5 times a night to urinate. no burning during urination. pt states this is reason as to why he cannot sleep. after waking up he has trouble going back to sleep. pt states lack of sleep is making him cranky.) and Nutrition Counseling (pt currently sees nutritionist and has f/u appointment in Aug.)   HPI   Chief Complaint  Patient presents with  . Urinary Frequency    at night time. pt states he wakes up during the night 2-5 times a night to urinate. no burning during urination. pt states this is reason as to why he cannot sleep. after waking up he has trouble going back to sleep. pt states lack of sleep is making him cranky.  . Nutrition Counseling    pt currently sees nutritionist and has f/u appointment in Aug.   Pt goes to bed 830-930 pm. He gets up around 430am  He drinks artificially sweetened tea and fizzy water mostly.  He uses the bathroom 3 or 4 times daily.  He uses it frequently during the night as above.   Pt is very frustrated at his lack of weight loss.  He says he isn't eating very much.  He says he walks in his driveway and when he accompanies his mother to [place].    Relevant past medical, surgical, family and social history reviewed and updated as indicated. Interim medical history since our last visit reviewed. Allergies and medications reviewed and updated.   Current Outpatient Medications:  .  atenolol (TENORMIN) 50 MG tablet, Take 1 tablet (50 mg total) by mouth daily. (Patient taking differently: Take 50 mg by mouth  every evening. ), Disp: 30 tablet, Rfl: 3 .  Camphor-Eucalyptus-Menthol (VICKS VAPORUB EX), Place 1 application into both nostrils at bedtime as needed (allergies)., Disp: , Rfl:  .  celecoxib (CELEBREX) 200 MG capsule, TAKE 1 CAPSULE BY MOUTH 2 TIMES DAILY AS NEEDED., Disp: 60 capsule, Rfl: 0 .  lisinopril (PRINIVIL,ZESTRIL) 20 MG tablet, Take 1 tablet (20 mg total) by mouth daily., Disp: 30 tablet, Rfl: 3 .  metFORMIN (GLUCOPHAGE-XR) 500 MG 24 hr tablet, TAKE 1 TABLET BY MOUTH ONCE DAILY WITH BREAKFAST, Disp: 30 tablet, Rfl: 2 .  simvastatin (ZOCOR) 20 MG tablet, Take 1 tablet (20 mg total) by mouth at bedtime., Disp: 30 tablet, Rfl: 3   Review of Systems  Constitutional: Positive for fatigue. Negative for appetite change, chills, diaphoresis, fever and unexpected weight change.  HENT: Negative for congestion, dental problem, drooling, ear pain, facial swelling, hearing loss, mouth sores, sneezing, sore throat, trouble swallowing and voice change.   Eyes: Negative for pain, discharge, redness, itching and visual disturbance.  Respiratory: Negative for cough, choking, shortness of breath and wheezing.   Cardiovascular: Negative for chest pain, palpitations and leg swelling.  Gastrointestinal: Negative for abdominal pain, blood in stool, constipation, diarrhea and vomiting.  Endocrine: Negative for cold intolerance, heat intolerance and polydipsia.  Genitourinary: Negative for decreased urine volume, dysuria and hematuria.  Musculoskeletal: Negative for arthralgias, back pain and gait problem.  Skin: Negative for rash.  Allergic/Immunologic: Negative for environmental allergies.  Neurological: Positive for headaches. Negative for seizures, syncope and light-headedness.  Hematological: Negative for adenopathy.  Psychiatric/Behavioral: Positive for agitation. Negative for dysphoric mood and suicidal ideas. The patient is not nervous/anxious.     Per HPI unless specifically indicated above      Objective:    BP 106/62 (BP Location: Left Arm, Patient Position: Sitting, Cuff Size: Normal)   Pulse (!) 53   Temp 97.7 F (36.5 C)   Ht 5\' 11"  (1.803 m)   Wt (!) 357 lb 12 oz (162.3 kg)   SpO2 95%   BMI 49.90 kg/m   Wt Readings from Last 3 Encounters:  10/31/17 (!) 357 lb 12 oz (162.3 kg)  10/16/17 (!) 356 lb (161.5 kg)  10/10/17 (!) 358 lb (162.4 kg)    Physical Exam  Constitutional: He is oriented to person, place, and time. He appears well-developed and well-nourished.  HENT:  Head: Normocephalic and atraumatic.  Neck: Neck supple.  Cardiovascular: Normal rate and regular rhythm.  Pulmonary/Chest: Effort normal and breath sounds normal. He has no wheezes.  Abdominal: Soft. Bowel sounds are normal. There is no hepatosplenomegaly. There is no tenderness.  Musculoskeletal: He exhibits no edema.  Lymphadenopathy:    He has no cervical adenopathy.  Neurological: He is alert and oriented to person, place, and time.  Skin: Skin is warm and dry.  Psychiatric: He has a normal mood and affect. His behavior is normal.  Vitals reviewed.  UA unremarkable     Assessment & Plan:   Encounter Diagnoses  Name Primary?  . Urinary frequency Yes  . Nocturia   . Morbid obesity ([place])   . Physical deconditioning      -counseled pt No drinking after 6pm (since he goes to bed 8:30pm)   -pt to avoid caffeine -pt to drink plenty of water during the daytime/early/many hours before he goes to bed -discussed with pt that he needs to work on increasing his walking- talked about timing himself to monitor his progress.  For example if he can only walk for 4 minutes continuously this week, increase it to 5 minutes at a time the following week.  Discussed that if he is only walking for such short time periods, he needs to do it multiple times in the day.  Goal is to walk 45-60 minutes 5 or 6 days/week -pt became angry and hostile saying that he wasn't lazy.  Also angry because he wasn't allowed  to list each food he eats daily.  Discussed that he sees a nutritionist that he can discuss eat food item with and that was a big purpose for his being referred there.  -pt to follow up in August as scheduled.  RTO sooner prn worsening or new symptoms

## 2017-11-06 ENCOUNTER — Other Ambulatory Visit: Payer: Self-pay | Admitting: Physician Assistant

## 2017-11-07 ENCOUNTER — Other Ambulatory Visit: Payer: Self-pay | Admitting: Physician Assistant

## 2017-11-07 MED ORDER — LISINOPRIL 20 MG PO TABS: 20.0000 mg | ORAL_TABLET | Freq: Every day | ORAL | 3 refills | Status: DC

## 2017-11-14 ENCOUNTER — Other Ambulatory Visit: Payer: Self-pay | Admitting: Physician Assistant

## 2017-12-17 ENCOUNTER — Encounter: Payer: Self-pay | Admitting: Physician Assistant

## 2018-01-01 ENCOUNTER — Other Ambulatory Visit (HOSPITAL_COMMUNITY)
Admission: RE | Admit: 2018-01-01 | Discharge: 2018-01-01 | Disposition: A | Discharge status: Home / Self Care | Payer: Self-pay | Source: Ambulatory Visit | Attending: Physician Assistant | Admitting: Physician Assistant

## 2018-01-01 DIAGNOSIS — E119 Type 2 diabetes mellitus without complications: Secondary | ICD-10-CM | POA: Insufficient documentation

## 2018-01-01 DIAGNOSIS — I10 Essential (primary) hypertension: Secondary | ICD-10-CM | POA: Insufficient documentation

## 2018-01-01 DIAGNOSIS — E785 Hyperlipidemia, unspecified: Secondary | ICD-10-CM | POA: Insufficient documentation

## 2018-01-01 LAB — COMPREHENSIVE METABOLIC PANEL
ALK PHOS: 81 U/L (ref 38–126)
ALT: 23 U/L (ref 0–44)
ANION GAP: 10 (ref 5–15)
AST: 16 U/L (ref 15–41)
Albumin: 3.3 g/dL — ABNORMAL LOW (ref 3.5–5.0)
BUN: 8 mg/dL (ref 6–20)
CALCIUM: 8.8 mg/dL — AB (ref 8.9–10.3)
CO2: 25 mmol/L (ref 22–32)
Chloride: 104 mmol/L (ref 98–111)
Creatinine, Ser: 0.99 mg/dL (ref 0.61–1.24)
GLUCOSE: 158 mg/dL — AB (ref 70–99)
Potassium: 3.7 mmol/L (ref 3.5–5.1)
Sodium: 139 mmol/L (ref 135–145)
Total Bilirubin: 0.5 mg/dL (ref 0.3–1.2)
Total Protein: 6.8 g/dL (ref 6.5–8.1)

## 2018-01-01 LAB — HEMOGLOBIN A1C
Hgb A1c MFr Bld: 6.2 % — ABNORMAL HIGH (ref 4.8–5.6)
MEAN PLASMA GLUCOSE: 131.24 mg/dL

## 2018-01-01 LAB — LIPID PANEL
CHOL/HDL RATIO: 4.5 ratio
CHOLESTEROL: 130 mg/dL (ref 0–200)
HDL: 29 mg/dL — AB (ref >40)
LDL Cholesterol: 77 mg/dL (ref 0–99)
TRIGLYCERIDES: 121 mg/dL (ref <150)
VLDL: 24 mg/dL (ref 0–40)

## 2018-01-03 ENCOUNTER — Ambulatory Visit: Payer: Self-pay | Admitting: Physician Assistant

## 2018-01-03 ENCOUNTER — Encounter: Payer: Self-pay | Admitting: Physician Assistant

## 2018-01-03 VITALS — BP 123/72 | HR 54 | Temp 97.8°F | Ht 71.0 in | Wt 346.0 lb

## 2018-01-03 DIAGNOSIS — E785 Hyperlipidemia, unspecified: Secondary | ICD-10-CM

## 2018-01-03 DIAGNOSIS — I10 Essential (primary) hypertension: Secondary | ICD-10-CM

## 2018-01-03 DIAGNOSIS — F17219 Nicotine dependence, cigarettes, with unspecified nicotine-induced disorders: Secondary | ICD-10-CM

## 2018-01-03 DIAGNOSIS — E119 Type 2 diabetes mellitus without complications: Secondary | ICD-10-CM

## 2018-01-03 NOTE — Progress Notes (Signed)
BP 123/72   Pulse (!) 54   Temp 97.8 F (36.6 C)   Ht 5\' 11"  (1.803 m)   Wt (!) 346 lb (156.9 kg)   SpO2 96%   BMI 48.26 kg/m    Subjective:    Patient ID: Thomas Myers, male    DOB: 09/28/1970, 47 y.o.   MRN: [id number]  HPI: Thomas Myers is a 47 y.o. male presenting on 01/03/2018 for Diabetes; Hypertension; and Hyperlipidemia   HPI   Pt doing well.  He is trying not to smoke so much so he feels a little anxious.  He is mostly healed up from his hernia surgery  Pt has no new issues today  Relevant past medical, surgical, family and social history reviewed and updated as indicated. Interim medical history since our last visit reviewed. Allergies and medications reviewed and updated.   Current Outpatient Medications:  .  atenolol (TENORMIN) 50 MG tablet, TAKE 1 TABLET BY MOUTH ONCE DAILY, Disp: 30 tablet, Rfl: 3 .  Camphor-Eucalyptus-Menthol (VICKS VAPORUB EX), Place 1 application into both nostrils at bedtime as needed (allergies)., Disp: , Rfl:  .  lisinopril (PRINIVIL,ZESTRIL) 20 MG tablet, Take 1 tablet (20 mg total) by mouth daily., Disp: 30 tablet, Rfl: 3 .  metFORMIN (GLUCOPHAGE-XR) 500 MG 24 hr tablet, TAKE 1 TABLET BY MOUTH ONCE DAILY WITH BREAKFAST, Disp: 30 tablet, Rfl: 2 .  simvastatin (ZOCOR) 20 MG tablet, Take 1 tablet (20 mg total) by mouth at bedtime., Disp: 30 tablet, Rfl: 3  Review of Systems  Constitutional: Positive for fatigue. Negative for appetite change, chills, diaphoresis, fever and unexpected weight change.  HENT: Negative for congestion, dental problem, drooling, ear pain, facial swelling, hearing loss, mouth sores, sneezing, sore throat, trouble swallowing and voice change.   Eyes: Negative for pain, discharge, redness, itching and visual disturbance.  Respiratory: Negative for cough, choking, shortness of breath and wheezing.   Cardiovascular: Negative for chest pain, palpitations and leg swelling.  Gastrointestinal: Negative for  abdominal pain, blood in stool, constipation, diarrhea and vomiting.  Endocrine: Negative for cold intolerance, heat intolerance and polydipsia.  Genitourinary: Negative for decreased urine volume, dysuria and hematuria.  Musculoskeletal: Negative for arthralgias, back pain and gait problem.  Skin: Negative for rash.  Allergic/Immunologic: Negative for environmental allergies.  Neurological: Negative for seizures, syncope, light-headedness and headaches.  Hematological: Negative for adenopathy.  Psychiatric/Behavioral: Negative for agitation, dysphoric mood and suicidal ideas. The patient is nervous/anxious.     Per HPI unless specifically indicated above     Objective:    BP 123/72   Pulse (!) 54   Temp 97.8 F (36.6 C)   Ht 5\' 11"  (1.803 m)   Wt (!) 346 lb (156.9 kg)   SpO2 96%   BMI 48.26 kg/m   Wt Readings from Last 3 Encounters:  01/03/18 (!) 346 lb (156.9 kg)  10/31/17 (!) 357 lb 12 oz (162.3 kg)  10/16/17 (!) 356 lb (161.5 kg)    Physical Exam  Constitutional: He is oriented to person, place, and time. He appears well-developed and well-nourished.  HENT:  Head: Normocephalic and atraumatic.  Neck: Neck supple.  Cardiovascular: Normal rate and regular rhythm.  Pulmonary/Chest: Effort normal and breath sounds normal. He has no wheezes.  Abdominal: Soft. Bowel sounds are normal. There is no hepatosplenomegaly. There is no tenderness.  Surgical sites well healed with no signs infection  Musculoskeletal: He exhibits no edema.  Lymphadenopathy:    He has cervical adenopathy (node R  neck smaller than at previous OV.  soft mobile and nontender).  Neurological: He is alert and oriented to person, place, and time.  Skin: Skin is warm and dry.  Psychiatric: He has a normal mood and affect. His behavior is normal.  Vitals reviewed.   Results for orders placed or performed during the hospital encounter of 01/01/18  Hemoglobin A1c  Result Value Ref Range   Hgb A1c MFr Bld  6.2 (H) 4.8 - 5.6 %   Mean Plasma Glucose 131.24 mg/dL  Lipid panel  Result Value Ref Range   Cholesterol 130 0 - 200 mg/dL   Triglycerides 121 <150 mg/dL   HDL 29 (L) >40 mg/dL   Total CHOL/HDL Ratio 4.5 RATIO   VLDL 24 0 - 40 mg/dL   LDL Cholesterol 77 0 - 99 mg/dL  Comprehensive metabolic panel  Result Value Ref Range   Sodium 139 135 - 145 mmol/L   Potassium 3.7 3.5 - 5.1 mmol/L   Chloride 104 98 - 111 mmol/L   CO2 25 22 - 32 mmol/L   Glucose, Bld 158 (H) 70 - 99 mg/dL   BUN 8 6 - 20 mg/dL   Creatinine, Ser 0.99 0.61 - 1.24 mg/dL   Calcium 8.8 (L) 8.9 - 10.3 mg/dL   Total Protein 6.8 6.5 - 8.1 g/dL   Albumin 3.3 (L) 3.5 - 5.0 g/dL   AST 16 15 - 41 U/L   ALT 23 0 - 44 U/L   Alkaline Phosphatase 81 38 - 126 U/L   Total Bilirubin 0.5 0.3 - 1.2 mg/dL   GFR calc non Af Amer >60 >60 mL/min   GFR calc Af Amer >60 >60 mL/min   Anion gap 10 5 - 15      Assessment & Plan:   Encounter Diagnoses  Name Primary?  . Diabetes mellitus without complication ([place]) Yes  . Essential hypertension   . Hyperlipidemia, unspecified hyperlipidemia type   . Morbid obesity ([place])   . Cigarette nicotine dependence with nicotine-induced disorder      -reviewed labs with pt -counseled on smoking cessation and weight management -pt to continue current medications -pt to follow up in 3 months.  RTO sooner prn

## 2018-01-12 ENCOUNTER — Other Ambulatory Visit: Payer: Self-pay | Admitting: Physician Assistant

## 2018-01-16 ENCOUNTER — Encounter: Payer: Self-pay | Admitting: Nutrition

## 2018-01-16 ENCOUNTER — Encounter: Payer: Self-pay | Attending: Physician Assistant | Admitting: Nutrition

## 2018-01-16 VITALS — Ht 73.0 in | Wt 339.0 lb

## 2018-01-16 DIAGNOSIS — E119 Type 2 diabetes mellitus without complications: Secondary | ICD-10-CM

## 2018-01-16 DIAGNOSIS — E669 Obesity, unspecified: Secondary | ICD-10-CM

## 2018-01-16 NOTE — Patient Instructions (Addendum)
Goals 1. Add fruit with breakfast 2. Cut out juices. 3. Increase walking. 30-45 minutes 3 times per week or daily if possible 4/ Increase low carb vegetables. Use ice on ankle after walking.. Lose 1-2 lbs per week.

## 2018-01-16 NOTE — Progress Notes (Signed)
  Medical Nutrition Therapy:  Appt start time: 930 end time:  1000  Assessment:  Primary concerns today: Diabetes and Morbid Obesity.  Lost 7 lbs. Is now taking Melatonin to help with sleep. Trying to go to bed early. Has been canning some with his mom. Has been eating more vegetables. Working on quitting smoking. Vaping some also Metformin 500 mg BID. Has swelling in his left ankle at times when on his feet too much. Limits his activity. Engaged and motivated to continue making lifestyle changes.   Lab Results  Component Value Date   HGBA1C 6.2 (H) 01/01/2018       CMP Latest Ref Rng & Units 01/01/2018 09/11/2017 06/04/2017  Glucose 70 - 99 mg/dL 158(H) 117(H) 110(H)  BUN 6 - 20 mg/dL 8 10 12   Creatinine 0.61 - 1.24 mg/dL 0.99 0.80 0.90  Sodium 135 - 145 mmol/L 139 137 138  Potassium 3.5 - 5.1 mmol/L 3.7 3.9 4.0  Chloride 98 - 111 mmol/L 104 102 104  CO2 22 - 32 mmol/L 25 24 27   Calcium 8.9 - 10.3 mg/dL 8.8(L) 8.5(L) 8.8(L)  Total Protein 6.5 - 8.1 g/dL 6.8 - 6.8  Total Bilirubin 0.3 - 1.2 mg/dL 0.5 - 0.3  Alkaline Phos 38 - 126 U/L 81 - 93  AST 15 - 41 U/L 16 - 16  ALT 0 - 44 U/L 23 - 25   Lab Results  Component Value Date   HGBA1C 6.2 (H) 01/01/2018      Preferred Learning Style:   No preference indicated   Learning Readiness:   Ready  Change in progress  MEDICATIONS:   DIETARY INTAKE:   B) Boiled eggs and 1 slice toast, Milk 2% 5-18 oz. L) Egg salad on wheat bread, baked lay chips-12, water D)  corn on cob, blackeyed peas, conrbread,  Pineapple, wate   Usual physical activity: ADL  Estimated energy needs: 1800  calories 200  g carbohydrates 135 g protein 50 g fat  Progress Towards Goal(s):  In progress.   Nutritional Diagnosis:  NB-1.1 Food and nutrition-related knowledge deficit As related to DM and Morbid Obesity.  As evidenced by A1C 6.5% and BMI 49..    Intervention:  Nutrition and Diabetes education provided on My Plate, CHO counting, meal  planning, portion sizes, timing of meals, avoiding snacks between meals unless having a low blood sugar, target ranges for A1C and blood sugars, signs/symptoms and treatment of hyper/hypoglycemia, monitoring blood sugars, taking medications as prescribed, benefits of exercising 30 minutes per day and prevention of complications of DM.   Goals 1. Add fruit with breakfast 2. Cut out juices. 3. Increase walking. 30-45 minutes 3 times per week or daily if possible 4/ Increase low carb vegetables. Use ice on ankle after walking.. Lose 1-2 lbs per week.  Teaching Method Utilized: none Visual Auditory Hands on  Handouts given during visit include:  The Plate Method   Meal Plan Card    Barriers to learning/adherence to lifestyle change: none  Demonstrated degree of understanding via:  Teach Back   Monitoring/Evaluation:  Dietary intake, exercise, meal planning, and body weight in 1 month(s). He may benefit from a sleep study due to obesity and poor sleep and waking up with some anxiety or panic attacks.

## 2018-03-18 ENCOUNTER — Other Ambulatory Visit: Payer: Self-pay | Admitting: Physician Assistant

## 2018-03-29 ENCOUNTER — Other Ambulatory Visit (HOSPITAL_COMMUNITY)
Admission: RE | Admit: 2018-03-29 | Discharge: 2018-03-29 | Disposition: A | Discharge status: Home / Self Care | Payer: Self-pay | Source: Ambulatory Visit | Attending: Physician Assistant | Admitting: Physician Assistant

## 2018-03-29 DIAGNOSIS — E785 Hyperlipidemia, unspecified: Secondary | ICD-10-CM | POA: Insufficient documentation

## 2018-03-29 DIAGNOSIS — E119 Type 2 diabetes mellitus without complications: Secondary | ICD-10-CM | POA: Insufficient documentation

## 2018-03-29 DIAGNOSIS — I10 Essential (primary) hypertension: Secondary | ICD-10-CM | POA: Insufficient documentation

## 2018-03-29 LAB — LIPID PANEL
Cholesterol: 157 mg/dL (ref 0–200)
HDL: 33 mg/dL — ABNORMAL LOW (ref >40)
LDL CALC: 107 mg/dL — AB (ref 0–99)
TRIGLYCERIDES: 86 mg/dL (ref <150)
Total CHOL/HDL Ratio: 4.8 RATIO
VLDL: 17 mg/dL (ref 0–40)

## 2018-03-29 LAB — COMPREHENSIVE METABOLIC PANEL
ALT: 22 U/L (ref 0–44)
AST: 14 U/L — AB (ref 15–41)
Albumin: 3.7 g/dL (ref 3.5–5.0)
Alkaline Phosphatase: 82 U/L (ref 38–126)
Anion gap: 6 (ref 5–15)
BUN: 9 mg/dL (ref 6–20)
CHLORIDE: 105 mmol/L (ref 98–111)
CO2: 27 mmol/L (ref 22–32)
Calcium: 8.9 mg/dL (ref 8.9–10.3)
Creatinine, Ser: 0.85 mg/dL (ref 0.61–1.24)
GFR calc Af Amer: >60 mL/min (ref >60)
GLUCOSE: 117 mg/dL — AB (ref 70–99)
POTASSIUM: 4.4 mmol/L (ref 3.5–5.1)
SODIUM: 138 mmol/L (ref 135–145)
Total Bilirubin: 0.6 mg/dL (ref 0.3–1.2)
Total Protein: 7.4 g/dL (ref 6.5–8.1)

## 2018-03-29 LAB — HEMOGLOBIN A1C
HEMOGLOBIN A1C: 6.3 % — AB (ref 4.8–5.6)
Mean Plasma Glucose: 134.11 mg/dL

## 2018-03-30 LAB — MICROALBUMIN, URINE: Microalb, Ur: 4.6 ug/mL — ABNORMAL HIGH

## 2018-04-04 ENCOUNTER — Encounter: Payer: Self-pay | Admitting: Physician Assistant

## 2018-04-04 ENCOUNTER — Ambulatory Visit: Payer: Self-pay | Admitting: Physician Assistant

## 2018-04-04 VITALS — BP 130/70 | HR 88 | Temp 97.3°F | Ht 71.0 in | Wt 349.5 lb

## 2018-04-04 DIAGNOSIS — E785 Hyperlipidemia, unspecified: Secondary | ICD-10-CM

## 2018-04-04 DIAGNOSIS — F17200 Nicotine dependence, unspecified, uncomplicated: Secondary | ICD-10-CM

## 2018-04-04 DIAGNOSIS — E118 Type 2 diabetes mellitus with unspecified complications: Secondary | ICD-10-CM

## 2018-04-04 DIAGNOSIS — I10 Essential (primary) hypertension: Secondary | ICD-10-CM

## 2018-04-04 DIAGNOSIS — B353 Tinea pedis: Secondary | ICD-10-CM

## 2018-04-04 MED ORDER — ATENOLOL 50 MG PO TABS: 50.0000 mg | ORAL_TABLET | Freq: Every day | ORAL | 1 refills | Status: DC

## 2018-04-04 MED ORDER — LISINOPRIL 20 MG PO TABS: 20.0000 mg | ORAL_TABLET | Freq: Every day | ORAL | 1 refills | Status: DC

## 2018-04-04 MED ORDER — SIMVASTATIN 20 MG PO TABS: 20.0000 mg | ORAL_TABLET | Freq: Every day | ORAL | 1 refills | Status: DC

## 2018-04-04 MED ORDER — METFORMIN HCL ER 500 MG PO TB24: ORAL_TABLET | ORAL | 1 refills | Status: DC

## 2018-04-04 MED ORDER — GABAPENTIN 100 MG PO CAPS: 100.0000 mg | ORAL_CAPSULE | Freq: Two times a day (BID) | ORAL | 4 refills | Status: DC

## 2018-04-04 NOTE — Patient Instructions (Addendum)
Diabetic Neuropathy Diabetic neuropathy is a nerve disease or nerve damage that is caused by diabetes mellitus. About half of all people with diabetes mellitus have some form of nerve damage. Nerve damage is more common in those who have had diabetes mellitus for many years and who generally have not had good control of their blood sugar (glucose) level. Diabetic neuropathy is a common complication of diabetes mellitus. There are three common types of diabetic neuropathy and a fourth type that is less common and less understood:  Peripheral neuropathy-This is the most common type of diabetic neuropathy. It causes damage to the nerves of the feet and legs first and then eventually the hands and arms. The damage affects the ability to sense touch.  Autonomic neuropathy-This type causes damage to the autonomic nervous system, which controls the following functions: ? Heartbeat. ? Body temperature. ? Blood pressure. ? Urination. ? Digestion. ? Sweating. ? Sexual function.  Focal neuropathy-Focal neuropathy can be painful and unpredictable and occurs most often in older adults with diabetes mellitus. It involves a specific nerve or one area and often comes on suddenly. It usually does not cause long-term problems.  Radiculoplexus neuropathy- Sometimes called lumbosacral radiculoplexus neuropathy, radiculoplexus neuropathy affects the nerves of the thighs, hips, buttocks, or legs. It is more common in people with type 2 diabetes mellitus and in older men. It is characterized by debilitating pain, weakness, and atrophy, usually in the thigh muscles.  What are the causes? The cause of peripheral, autonomic, and focal neuropathies is diabetes mellitus that is uncontrolled and high glucose levels. The cause of radiculoplexus neuropathy is unknown. However, it is thought to be caused by inflammation related to uncontrolled glucose levels. What are the signs or symptoms? Peripheral Neuropathy Peripheral  neuropathy develops slowly over time. When the nerves of the feet and legs no longer work there may be:  Burning, stabbing, or aching pain in the legs or feet.  Inability to feel pressure or pain in your feet. This can lead to: ? Thick calluses over pressure areas. ? Pressure sores. ? Ulcers.  Foot deformities.  Reduced ability to feel temperature changes.  Muscle weakness.  Autonomic Neuropathy The symptoms of autonomic neuropathy vary depending on which nerves are affected. Symptoms may include:  Problems with digestion, such as: ? Feeling sick to your stomach (nausea). ? Vomiting. ? Bloating. ? Constipation. ? Diarrhea. ? Abdominal pain.  Difficulty with urination. This occurs if you lose your ability to sense when your bladder is full. Problems include: ? Urine leakage (incontinence). ? Inability to empty your bladder completely (retention).  Rapid or irregular heartbeat (palpitations).  Blood pressure drops when you stand up (orthostatic hypotension). When you stand up you may feel: ? Dizzy. ? Weak. ? Faint.  In men, inability to attain and maintain an erection.  In women, vaginal dryness and problems with decreased sexual desire and arousal.  Problems with body temperature regulation.  Increased or decreased sweating.  Focal Neuropathy  Abnormal eye movements or abnormal alignment of both eyes.  Weakness in the wrist.  Foot drop. This results in an inability to lift the foot properly and abnormal walking or foot movement.  Paralysis on one side of your face (Bell palsy).  Chest or abdominal pain. Radiculoplexus Neuropathy  Sudden, severe pain in your hip, thigh, or buttocks.  Weakness and wasting of thigh muscles.  Difficulty rising from a seated position.  Abdominal swelling.  Unexplained weight loss (usually more than 10 lb [4.5 kg]). How is   this diagnosed? Peripheral Neuropathy Your senses may be tested. Sensory function testing can be  done with:  A light touch using a monofilament.  A vibration with tuning fork.  A sharp sensation with a pin prick.  Other tests that can help diagnose neuropathy are:  Nerve conduction velocity. This test checks the transmission of an electrical current through a nerve.  Electromyography. This shows how muscles respond to electrical signals transmitted by nearby nerves.  Quantitative sensory testing. This is used to assess how your nerves respond to vibrations and changes in temperature.  Autonomic Neuropathy Diagnosis is often based on reported symptoms. Tell your health care provider if you experience:  Dizziness.  Constipation.  Diarrhea.  Inappropriate urination or inability to urinate.  Inability to get or maintain an erection.  Tests that may be done include:  Electrocardiography or Holter monitor. These are tests that can help show problems with the heart rate or heart rhythm.  An X-ray exam may be done.  Focal Neuropathy Diagnosis is made based on your symptoms and what your health care provider finds during your exam. Other tests may be done. They may include:  Nerve conduction velocities. This checks the transmission of electrical current through a nerve.  Electromyography. This shows how muscles respond to electrical signals transmitted by nearby nerves.  Quantitative sensory testing. This test is used to assess how your nerves respond to vibration and changes in temperature.  Radiculoplexus Neuropathy  Often the first thing is to eliminate any other issue or problems that might be the cause, as there is no standard test for diagnosis.  X-ray exam of your spine and lumbar region.  Spinal tap to rule out cancer.  MRI to rule out other lesions. How is this treated? Once nerve damage occurs, it cannot be reversed. The goal of treatment is to keep the disease or nerve damage from getting worse and affecting more nerve fibers. Controlling your blood  glucose level is the key. Most people with radiculoplexus neuropathy see at least a partial improvement over time. You will need to keep your blood glucose and HbA1c levels in the target range determined by your health care provider. Things that help control blood glucose levels include:  Blood glucose monitoring.  Meal planning.  Physical activity.  Diabetes medicine.  Over time, maintaining lower blood glucose levels helps lessen symptoms. Sometimes, prescription pain medicine is needed. Follow these instructions at home:  Do not smoke.  Keep your blood glucose level in the range that you and your health care provider have determined acceptable for you.  Keep your blood pressure level in the range that you and your health care provider have determined acceptable for you.  Eat a well-balanced diet.  Be physically active every day. Include strength training and balance exercises.  Protect your feet. ? Check your feet every day for sores, cuts, blisters, or signs of infection. ? Wear padded socks and supportive shoes. Use orthotic inserts, if necessary. ? Regularly check the insides of your shoes for worn spots. Make sure there are no rocks or other items inside your shoes before you put them on. Contact a health care provider if:  You have burning, stabbing, or aching pain in the legs or feet.  You are unable to feel pressure or pain in your feet.  You develop problems with digestion such as: ? Nausea. ? Vomiting. ? Bloating. ? Constipation. ? Diarrhea. ? Abdominal pain.  You have difficulty with urination, such as: ? Incontinence. ? Retention.    You have palpitations.  You develop orthostatic hypotension. When you stand up you may feel: ? Dizzy. ? Weak. ? Faint.  You cannot attain and maintain an erection (in men).  You have vaginal dryness and problems with decreased sexual desire and arousal (in women).  You have severe pain in your thighs, legs, or  buttocks.  You have unexplained weight loss. This information is not intended to replace advice given to you by your health care provider. Make sure you discuss any questions you have with your health care provider. Document Released: 07/24/2001 Document Revised: 10/21/2015 Document Reviewed: 10/24/2012 Elsevier Interactive Patient Education  2017 [employer]   -------------------------------------------------------------------------------   Athlete's Foot Athlete's foot (tinea pedis) is a fungal infection of the skin on the feet. It often occurs on the skin that is between or underneath the toes. It can also occur on the soles of the feet. The infection can spread from person to person (is contagious). What are the causes? Athlete's foot is caused by a fungus. This fungus grows in warm, moist places. Most people get athlete's foot by sharing shower stalls, towels, and wet floors with someone who is infected. Not washing your feet or changing your socks often enough can contribute to athlete's foot. What increases the risk? This condition is more likely to develop in:  Men.  People who have a weak body defense system (immune system).  People who have diabetes.  People who use public showers, such as at a gym.  People who wear heavy-duty shoes, such as Environmental manager.  Seasons with warm, humid weather.  What are the signs or symptoms? Symptoms of this condition include:  Itchy areas between the toes or on the soles of the feet.  White, flaky, or scaly areas between the toes or on the soles of the feet.  Very itchy small blisters between the toes or on the soles of the feet.  Small cuts on the skin. These cuts can become infected.  Thick or discolored toenails.  How is this diagnosed? This condition is diagnosed with a medical history and physical exam. Your health care provider may also take a skin or toenail sample to be examined. How is this  treated? Treatment for this condition includes antifungal medicines. These may be applied as powders, ointments, or creams. In severe cases, an oral antifungal medicine may be given. Follow these instructions at home:  Apply or take over-the-counter and prescription medicines only as told by your health care provider.  Keep all follow-up visits as told by your health care provider. This is important.  Do not scratch your feet.  Keep your feet dry: ? Wear cotton or wool socks. Change your socks every day or if they become wet. ? Wear shoes that allow air to circulate, such as sandals or canvas tennis shoes.  Wash and dry your feet: ? Every day or as told by your health care provider. ? After exercising. ? Including the area between your toes.  Do not share towels, nail clippers, or other personal items that touch your feet with others.  If you have diabetes, keep your blood sugar under control. How is this prevented?  Do not share towels.  Wear sandals in wet areas, such as locker rooms and shared showers.  Keep your feet dry: ? Wear cotton or wool socks. Change your socks every day or if they become wet. ? Wear shoes that allow air to circulate, such as sandals or canvas tennis shoes.  Wash and dry your feet after exercising. Pay attention to the area between your toes. Contact a health care provider if:  You have a fever.  You have swelling, soreness, warmth, or redness in your foot.  You are not getting better with treatment.  Your symptoms get worse.  You have new symptoms. This information is not intended to replace advice given to you by your health care provider. Make sure you discuss any questions you have with your health care provider. Document Released: 05/12/2000 Document Revised: 10/21/2015 Document Reviewed: 11/16/2014 Elsevier Interactive Patient Education  2018 [employer].

## 2018-04-04 NOTE — Progress Notes (Signed)
BP 130/70 (BP Location: Left Arm, Patient Position: Sitting, Cuff Size: Large)   Pulse 88   Temp (!) 97.3 F (36.3 C)   Ht 5\' 11"  (1.803 m)   Wt (!) 349 lb 8 oz (158.5 kg)   SpO2 96%   BMI 48.75 kg/m    Subjective:    Patient ID: Thomas Myers, male    DOB: 1970/11/02, 47 y.o.   MRN: [id number]  HPI: VALERIE FREDIN is a 47 y.o. male presenting on 04/04/2018 for Diabetes; Hypertension; and Hyperlipidemia   HPI   Pt feels L foot burning.  More after walking and then later that night.    Also has trouble sleeping through the night.   Relevant past medical, surgical, family and social history reviewed and updated as indicated. Interim medical history since our last visit reviewed. Allergies and medications reviewed and updated.   Current Outpatient Medications:  .  atenolol (TENORMIN) 50 MG tablet, TAKE 1 TABLET BY MOUTH ONCE DAILY, Disp: 30 tablet, Rfl: 3 .  lisinopril (PRINIVIL,ZESTRIL) 20 MG tablet, TAKE 1 TABLET BY MOUTH ONCE DAILY, Disp: 30 tablet, Rfl: 3 .  metFORMIN (GLUCOPHAGE-XR) 500 MG 24 hr tablet, TAKE 1 TABLET BY MOUTH ONCE DAILY WITH BREAKFAST, Disp: 30 tablet, Rfl: 4 .  simvastatin (ZOCOR) 20 MG tablet, Take 1 tablet (20 mg total) by mouth at bedtime., Disp: 30 tablet, Rfl: 3   Review of Systems  Constitutional: Negative for appetite change, chills, diaphoresis, fatigue, fever and unexpected weight change.  HENT: Positive for dental problem. Negative for congestion, drooling, ear pain, facial swelling, hearing loss, mouth sores, sneezing, sore throat, trouble swallowing and voice change.   Eyes: Negative for pain, discharge, redness, itching and visual disturbance.  Respiratory: Negative for cough, choking, shortness of breath and wheezing.   Cardiovascular: Positive for leg swelling. Negative for chest pain and palpitations.  Gastrointestinal: Negative for abdominal pain, blood in stool, constipation, diarrhea and vomiting.  Endocrine: Negative for cold  intolerance, heat intolerance and polydipsia.  Genitourinary: Negative for decreased urine volume, dysuria and hematuria.  Musculoskeletal: Negative for arthralgias, back pain and gait problem.  Skin: Negative for rash.  Allergic/Immunologic: Negative for environmental allergies.  Neurological: Negative for seizures, syncope, light-headedness and headaches.  Hematological: Negative for adenopathy.  Psychiatric/Behavioral: Negative for agitation, dysphoric mood and suicidal ideas. The patient is not nervous/anxious.     Per HPI unless specifically indicated above     Objective:    BP 130/70 (BP Location: Left Arm, Patient Position: Sitting, Cuff Size: Large)   Pulse 88   Temp (!) 97.3 F (36.3 C)   Ht 5\' 11"  (1.803 m)   Wt (!) 349 lb 8 oz (158.5 kg)   SpO2 96%   BMI 48.75 kg/m   Wt Readings from Last 3 Encounters:  04/04/18 (!) 349 lb 8 oz (158.5 kg)  01/16/18 (!) 339 lb (153.8 kg)  01/03/18 (!) 346 lb (156.9 kg)    Physical Exam  Constitutional: He is oriented to person, place, and time. He appears well-developed and well-nourished.  HENT:  Head: Normocephalic and atraumatic.  Neck: Neck supple.  Cardiovascular: Normal rate and regular rhythm.  Pulmonary/Chest: Effort normal and breath sounds normal. He has no wheezes.  Abdominal: Soft. Bowel sounds are normal. There is no hepatosplenomegaly. There is no tenderness.  Musculoskeletal: He exhibits no edema.  Lymphadenopathy:    He has no cervical adenopathy.  Neurological: He is alert and oriented to person, place, and time.  Skin: Skin  is warm and dry.  Pt counseled on antifungal cream for tinea pedis  Psychiatric: He has a normal mood and affect. His behavior is normal.  Vitals reviewed.   Results for orders placed or performed during the hospital encounter of 03/29/18  Hemoglobin A1c  Result Value Ref Range   Hgb A1c MFr Bld 6.3 (H) 4.8 - 5.6 %   Mean Plasma Glucose 134.11 mg/dL  Lipid panel  Result Value Ref  Range   Cholesterol 157 0 - 200 mg/dL   Triglycerides 86 <150 mg/dL   HDL 33 (L) >40 mg/dL   Total CHOL/HDL Ratio 4.8 RATIO   VLDL 17 0 - 40 mg/dL   LDL Cholesterol 107 (H) 0 - 99 mg/dL  Comprehensive metabolic panel  Result Value Ref Range   Sodium 138 135 - 145 mmol/L   Potassium 4.4 3.5 - 5.1 mmol/L   Chloride 105 98 - 111 mmol/L   CO2 27 22 - 32 mmol/L   Glucose, Bld 117 (H) 70 - 99 mg/dL   BUN 9 6 - 20 mg/dL   Creatinine, Ser 0.85 0.61 - 1.24 mg/dL   Calcium 8.9 8.9 - 10.3 mg/dL   Total Protein 7.4 6.5 - 8.1 g/dL   Albumin 3.7 3.5 - 5.0 g/dL   AST 14 (L) 15 - 41 U/L   ALT 22 0 - 44 U/L   Alkaline Phosphatase 82 38 - 126 U/L   Total Bilirubin 0.6 0.3 - 1.2 mg/dL   GFR calc non Af Amer >60 >60 mL/min   GFR calc Af Amer >60 >60 mL/min   Anion gap 6 5 - 15  Microalbumin, urine  Result Value Ref Range   Microalb, Ur 4.6 (H) Not Estab. ug/mL      Assessment & Plan:    Encounter Diagnoses  Name Primary?  . Controlled diabetes mellitus type 2 with complications, unspecified whether long term insulin use ([place]) Yes  . Essential hypertension   . Hyperlipidemia, unspecified hyperlipidemia type   . Morbid obesity ([place])   . Tobacco use disorder   . Tinea pedis of both feet     -Reviewed labs with pt -pt counseled to Watch lowfat diet and exercise regularly -pt to Continue current meds -will Add gabapentin for neuroapthy -counseled to use OTC antifungal for tinea pedis

## 2018-04-22 ENCOUNTER — Encounter: Payer: Self-pay | Attending: Physician Assistant | Admitting: Nutrition

## 2018-04-22 ENCOUNTER — Encounter: Payer: Self-pay | Admitting: Nutrition

## 2018-04-22 DIAGNOSIS — IMO0002 Reserved for concepts with insufficient information to code with codable children: Secondary | ICD-10-CM

## 2018-04-22 DIAGNOSIS — E782 Mixed hyperlipidemia: Secondary | ICD-10-CM

## 2018-04-22 DIAGNOSIS — E118 Type 2 diabetes mellitus with unspecified complications: Secondary | ICD-10-CM

## 2018-04-22 DIAGNOSIS — E1165 Type 2 diabetes mellitus with hyperglycemia: Secondary | ICD-10-CM

## 2018-04-22 NOTE — Progress Notes (Signed)
  Medical Nutrition Therapy:  Appt start time: 930 end time:  1000  Assessment:  Primary concerns today: Diabetes and Morbid Obesity.  Gained 18 lbs since August 2019.Started on Gadapentin for leg pain. Hasn't been walking. Eating 2 meals a day.  Drinking flavored water and sweet tea. A1C up to 6.3% from 6.2%.  Current diet is excessive in calories, fat, CHO and salt contributing to weight gain and elevated blood sugars.  Lab Results  Component Value Date   HGBA1C 6.3 (H) 03/29/2018      CMP Latest Ref Rng & Units 03/29/2018 01/01/2018 09/11/2017  Glucose 70 - 99 mg/dL 117(H) 158(H) 117(H)  BUN 6 - 20 mg/dL 9 8 10   Creatinine 0.61 - 1.24 mg/dL 0.85 0.99 0.80  Sodium 135 - 145 mmol/L 138 139 137  Potassium 3.5 - 5.1 mmol/L 4.4 3.7 3.9  Chloride 98 - 111 mmol/L 105 104 102  CO2 22 - 32 mmol/L 27 25 24   Calcium 8.9 - 10.3 mg/dL 8.9 8.8(L) 8.5(L)  Total Protein 6.5 - 8.1 g/dL 7.4 6.8 -  Total Bilirubin 0.3 - 1.2 mg/dL 0.6 0.5 -  Alkaline Phos 38 - 126 U/L 82 81 -  AST 15 - 41 U/L 14(L) 16 -  ALT 0 - 44 U/L 22 23 -   Lab Results  Component Value Date   HGBA1C 6.3 (H) 03/29/2018    Preferred Learning Style:   No preference indicated   Learning Readiness:   Ready  Change in progress  MEDICATIONS:   DIETARY INTAKE:   B) Toast 2 slices, OJ or Milk 6-30 oz Lunch and dinner combined. [place], stuffing, green beans, potatoes, sweet potato, Sweet tea Flavored. water  Usual physical activity: ADL  Estimated energy needs: 1800  calories 200  g carbohydrates 135 g protein 50 g fat  Progress Towards Goal(s):  In progress.   Nutritional Diagnosis:  NB-1.1 Food and nutrition-related knowledge deficit As related to DM and Morbid Obesity.  As evidenced by A1C 6.5% and BMI 49..    Intervention:  Nutrition and Diabetes education provided on My Plate, CHO counting, meal planning, portion sizes, timing of meals, avoiding snacks between meals unless having a low blood sugar, target  ranges for A1C and blood sugars, signs/symptoms and treatment of hyper/hypoglycemia, monitoring blood sugars, taking medications as prescribed, benefits of exercising 30 minutes per day and prevention of complications of DM.   Goals 1. Add fruit with breakfast 2. Cut out juices. 3. Increase walking. 30-45 minutes 3 times per week or daily if possible 4/ Increase low carb vegetables. Use ice on ankle after walking.. Lose 1-2 lbs per week.  Teaching Method Utilized: none Visual Auditory Hands on  Handouts given during visit include:  The Plate Method   Meal Plan Card    Barriers to learning/adherence to lifestyle change: none  Demonstrated degree of understanding via:  Teach Back   Monitoring/Evaluation:  Dietary intake, exercise, meal planning, and body weight in 1 month(s). Suggested to try and see if he can get a scholarship at the [facility] for exercise classes.

## 2018-04-22 NOTE — Patient Instructions (Signed)
Goals 1. Eat 3 balanced meals 2. Increase fresh fruits and vegetables. 3. Cut out flavored water 4. Exercise 60 minutes 3-4 times per week Lose 1-2 lbs per week.

## 2018-06-11 ENCOUNTER — Ambulatory Visit: Payer: Self-pay | Admitting: Nutrition

## 2018-06-13 ENCOUNTER — Encounter: Payer: Self-pay | Admitting: Physician Assistant

## 2018-06-13 ENCOUNTER — Ambulatory Visit: Payer: Self-pay | Admitting: Physician Assistant

## 2018-06-13 VITALS — BP 108/60 | HR 63 | Temp 95.0°F | Ht 71.0 in | Wt 361.0 lb

## 2018-06-13 DIAGNOSIS — J00 Acute nasopharyngitis [common cold]: Secondary | ICD-10-CM

## 2018-06-13 MED ORDER — PROMETHAZINE-DM 6.25-15 MG/5ML PO SYRP: 5.0000 mL | ORAL_SOLUTION | Freq: Four times a day (QID) | ORAL | 0 refills | Status: DC | PRN

## 2018-06-13 NOTE — Patient Instructions (Signed)

## 2018-06-13 NOTE — Progress Notes (Signed)
BP 108/60 (BP Location: Left Arm, Patient Position: Sitting, Cuff Size: Large)   Pulse 63   Ht 5\' 11"  (1.803 m)   Wt (!) 361 lb (163.7 kg)   SpO2 96%   BMI 50.35 kg/m    Subjective:    Patient ID: Thomas Myers, male    DOB: 1970/06/30, 48 y.o.   MRN: [id number]  HPI: Thomas Myers is a 48 y.o. male presenting on 06/13/2018 for Cough (c/o productive cough, headache, sore throat, fever, "hot and cold" and is achey for a week; has tried ibuprofen and Dimetapp without relief)   HPI   Chief Complaint  Patient presents with  . Cough    c/o productive cough, headache, sore throat, fever, "hot and cold" and is achey for a week; has tried ibuprofen and Dimetapp without relief   Fever subjective.  He continues to smoke.  symtpoms started last Thursday.  Relevant past medical, surgical, family and social history reviewed and updated as indicated. Interim medical history since our last visit reviewed. Allergies and medications reviewed and updated.  Review of Systems  Constitutional: Positive for chills, diaphoresis and fatigue. Negative for appetite change, fever and unexpected weight change.  HENT: Positive for congestion, sore throat and trouble swallowing. Negative for dental problem, drooling, ear pain, facial swelling, hearing loss, mouth sores, sneezing and voice change.   Eyes: Negative for pain, discharge, redness, itching and visual disturbance.  Respiratory: Positive for cough. Negative for choking, shortness of breath and wheezing.   Cardiovascular: Positive for chest pain (with cough). Negative for palpitations and leg swelling.  Gastrointestinal: Positive for diarrhea. Negative for abdominal pain, blood in stool, constipation and vomiting.  Endocrine: Negative for cold intolerance, heat intolerance and polydipsia.  Genitourinary: Negative for decreased urine volume, dysuria and hematuria.  Musculoskeletal: Negative for arthralgias, back pain and gait problem.  Skin:  Negative for rash.  Allergic/Immunologic: Negative for environmental allergies.  Neurological: Negative for seizures, syncope, light-headedness and headaches.  Hematological: Negative for adenopathy.  Psychiatric/Behavioral: Negative for agitation, dysphoric mood and suicidal ideas. The patient is not nervous/anxious.     Per HPI unless specifically indicated above     Objective:    BP 108/60 (BP Location: Left Arm, Patient Position: Sitting, Cuff Size: Large)   Pulse 63   Ht 5\' 11"  (1.803 m)   Wt (!) 361 lb (163.7 kg)   SpO2 96%   BMI 50.35 kg/m   Wt Readings from Last 3 Encounters:  06/13/18 (!) 361 lb (163.7 kg)  04/22/18 (!) 354 lb (160.6 kg)  04/04/18 (!) 349 lb 8 oz (158.5 kg)    Physical Exam Vitals signs reviewed.  Constitutional:      Appearance: He is well-developed.  HENT:     Head: Normocephalic and atraumatic.     Right Ear: Hearing, tympanic membrane, ear canal and external ear normal.     Left Ear: Hearing, tympanic membrane, ear canal and external ear normal.     Nose: Congestion and rhinorrhea present.     Mouth/Throat:     Pharynx: Uvula midline. No oropharyngeal exudate, posterior oropharyngeal erythema or uvula swelling.     Tonsils: No tonsillar abscesses.  Neck:     Musculoskeletal: Neck supple.  Cardiovascular:     Rate and Rhythm: Normal rate and regular rhythm.  Pulmonary:     Effort: Pulmonary effort is normal.     Breath sounds: Normal breath sounds.  Lymphadenopathy:     Cervical: No cervical adenopathy.  Skin:  General: Skin is warm and dry.  Neurological:     Mental Status: He is alert and oriented to person, place, and time.  Psychiatric:        Behavior: Behavior normal.         Assessment & Plan:    Encounter Diagnosis  Name Primary?  . Acute nasopharyngitis Yes    -gave rx for cough.  He is counseled to avoid driving on this medcaiton as it causes sedation -he is encouraged to avoid smoking so he can get  better -rest. Fluids.  OTCs prn -follow up as scheduled.  RTO sooner prn

## 2018-07-04 ENCOUNTER — Encounter: Payer: Self-pay | Admitting: Nutrition

## 2018-07-04 ENCOUNTER — Encounter: Payer: Self-pay | Attending: Physician Assistant | Admitting: Nutrition

## 2018-07-04 DIAGNOSIS — IMO0002 Reserved for concepts with insufficient information to code with codable children: Secondary | ICD-10-CM

## 2018-07-04 DIAGNOSIS — E118 Type 2 diabetes mellitus with unspecified complications: Secondary | ICD-10-CM | POA: Insufficient documentation

## 2018-07-04 DIAGNOSIS — E1165 Type 2 diabetes mellitus with hyperglycemia: Secondary | ICD-10-CM | POA: Insufficient documentation

## 2018-07-04 NOTE — Patient Instructions (Addendum)
Goals 1.Consistent exercise 60 minutes 4 times per week. 2. Increase lower carb vegetables with lunch and dinner 3. Cut out SF tea and flavored water and only drink water with lemon/lime. 4. Lose 1 lb per week. Get labs done today. Find a part time job.

## 2018-07-04 NOTE — Progress Notes (Signed)
  Medical Nutrition Therapy:  Appt start time: 930 end time:  1000  Assessment:  Primary concerns today: Diabetes and Morbid Obesity.  Gained 7 lbs. Complains of foot pain of neuropathy. Has been walking some but not consistently. Eating breakfast and trying to eat healthier food choices. Going to get labs done today. Sees PCP next week. Current diet is excessive in calories and low in fresh fruits and vegetables. Gabapentin may be contributing to weight gain as well.  Hasn't ate breakfast yet. Metformin ER  500 mg Daily Doesn't sleep well. Hasn't had a sleep study done. No insurance.    Lab Results  Component Value Date   HGBA1C 6.3 (H) 03/29/2018      CMP Latest Ref Rng & Units 03/29/2018 01/01/2018 09/11/2017  Glucose 70 - 99 mg/dL 117(H) 158(H) 117(H)  BUN 6 - 20 mg/dL 9 8 10   Creatinine 0.61 - 1.24 mg/dL 0.85 0.99 0.80  Sodium 135 - 145 mmol/L 138 139 137  Potassium 3.5 - 5.1 mmol/L 4.4 3.7 3.9  Chloride 98 - 111 mmol/L 105 104 102  CO2 22 - 32 mmol/L 27 25 24   Calcium 8.9 - 10.3 mg/dL 8.9 8.8(L) 8.5(L)  Total Protein 6.5 - 8.1 g/dL 7.4 6.8 -  Total Bilirubin 0.3 - 1.2 mg/dL 0.6 0.5 -  Alkaline Phos 38 - 126 U/L 82 81 -  AST 15 - 41 U/L 14(L) 16 -  ALT 0 - 44 U/L 22 23 -   Lab Results  Component Value Date   HGBA1C 6.3 (H) 03/29/2018    Preferred Learning Style:   No preference indicated   Learning Readiness:   Ready  Change in progress  MEDICATIONS:   DIETARY INTAKE:   B) Eggs, banana Lunch  [employer] bread, blackforest ham and mustard,  SF tea Dinner .  Meat and lima beans, potatoes, green beans SF tea Flavored. water  Usual physical activity: ADL  Estimated energy needs: 1800  calories 200  g carbohydrates 135 g protein 50 g fat  Progress Towards Goal(s):  In progress.   Nutritional Diagnosis:  NB-1.1 Food and nutrition-related knowledge deficit As related to DM and Morbid Obesity.  As evidenced by A1C 6.5% and BMI 49..    Intervention:  Nutrition and  Diabetes education provided on My Plate, CHO counting, meal planning, portion sizes, timing of meals, avoiding snacks between meals unless having a low blood sugar, target ranges for A1C and blood sugars, signs/symptoms and treatment of hyper/hypoglycemia, monitoring blood sugars, taking medications as prescribed, benefits of exercising 30 minutes per day and prevention of complications of DM.   Goals 1.Consistent exercise 60 minutes 4 times per week. 2. Increase lower carb vegetables with lunch and dinner 3. Cut out SF tea and flavored water and only drink water with lemon/lime. 4. Lose 1 lb per week. Get labs done today.  Teaching Method Utilized: none Visual Auditory Hands on  Handouts given during visit include:  The Plate Method   Meal Plan Card    Barriers to learning/adherence to lifestyle change: none  Demonstrated degree of understanding via:  Teach Back   Monitoring/Evaluation:  Dietary intake, exercise, meal planning, and body weight in 1 month(s)..Encouraged him to find a part time job. Need to follow up  on labs and A1C.

## 2018-07-05 ENCOUNTER — Other Ambulatory Visit (HOSPITAL_COMMUNITY)
Admission: RE | Admit: 2018-07-05 | Discharge: 2018-07-05 | Disposition: A | Discharge status: Home / Self Care | Payer: Self-pay | Source: Ambulatory Visit | Attending: Physician Assistant | Admitting: Physician Assistant

## 2018-07-05 DIAGNOSIS — E785 Hyperlipidemia, unspecified: Secondary | ICD-10-CM | POA: Insufficient documentation

## 2018-07-05 DIAGNOSIS — E118 Type 2 diabetes mellitus with unspecified complications: Secondary | ICD-10-CM | POA: Insufficient documentation

## 2018-07-05 DIAGNOSIS — I10 Essential (primary) hypertension: Secondary | ICD-10-CM | POA: Insufficient documentation

## 2018-07-05 LAB — COMPREHENSIVE METABOLIC PANEL
ALBUMIN: 3.6 g/dL (ref 3.5–5.0)
ALK PHOS: 75 U/L (ref 38–126)
ALT: 26 U/L (ref 0–44)
AST: 16 U/L (ref 15–41)
Anion gap: 7 (ref 5–15)
BILIRUBIN TOTAL: 0.6 mg/dL (ref 0.3–1.2)
BUN: 8 mg/dL (ref 6–20)
CALCIUM: 8.6 mg/dL — AB (ref 8.9–10.3)
CO2: 26 mmol/L (ref 22–32)
CREATININE: 0.88 mg/dL (ref 0.61–1.24)
Chloride: 105 mmol/L (ref 98–111)
GFR calc Af Amer: >60 mL/min (ref >60)
GFR calc non Af Amer: >60 mL/min (ref >60)
GLUCOSE: 137 mg/dL — AB (ref 70–99)
Potassium: 4.2 mmol/L (ref 3.5–5.1)
SODIUM: 138 mmol/L (ref 135–145)
TOTAL PROTEIN: 6.8 g/dL (ref 6.5–8.1)

## 2018-07-05 LAB — HEMOGLOBIN A1C
Hgb A1c MFr Bld: 6.8 % — ABNORMAL HIGH (ref 4.8–5.6)
Mean Plasma Glucose: 148.46 mg/dL

## 2018-07-05 LAB — LIPID PANEL
CHOLESTEROL: 140 mg/dL (ref 0–200)
HDL: 35 mg/dL — AB (ref >40)
LDL Cholesterol: 90 mg/dL (ref 0–99)
Total CHOL/HDL Ratio: 4 RATIO
Triglycerides: 76 mg/dL (ref <150)
VLDL: 15 mg/dL (ref 0–40)

## 2018-07-08 ENCOUNTER — Ambulatory Visit: Payer: Self-pay | Admitting: Physician Assistant

## 2018-07-11 ENCOUNTER — Ambulatory Visit: Payer: Self-pay | Admitting: Physician Assistant

## 2018-07-11 ENCOUNTER — Encounter: Payer: Self-pay | Admitting: Physician Assistant

## 2018-07-11 VITALS — BP 109/65 | HR 58 | Temp 97.5°F | Ht 71.0 in | Wt 354.5 lb

## 2018-07-11 DIAGNOSIS — F17200 Nicotine dependence, unspecified, uncomplicated: Secondary | ICD-10-CM

## 2018-07-11 DIAGNOSIS — I10 Essential (primary) hypertension: Secondary | ICD-10-CM

## 2018-07-11 DIAGNOSIS — E785 Hyperlipidemia, unspecified: Secondary | ICD-10-CM

## 2018-07-11 DIAGNOSIS — E118 Type 2 diabetes mellitus with unspecified complications: Secondary | ICD-10-CM

## 2018-07-11 NOTE — Progress Notes (Signed)
BP 109/65 (BP Location: Right Arm, Patient Position: Sitting, Cuff Size: Large)   Pulse (!) 58   Temp (!) 97.5 F (36.4 C) (Other (Comment))   Ht 5\' 11"  (1.803 m)   Wt (!) 354 lb 8 oz (160.8 kg)   SpO2 95%   BMI 49.44 kg/m    Subjective:    Patient ID: Thomas Myers, male    DOB: [birth year], 48 y.o.   MRN: [id number]  HPI: Thomas Myers is a 48 y.o. male presenting on 07/11/2018 for Diabetes; Hypertension; Hyperlipidemia; and Peripheral Neuropathy   HPI  Pt is doing well today.  He is still working on weight loss efforts.   Relevant past medical, surgical, family and social history reviewed and updated as indicated. Interim medical history since our last visit reviewed. Allergies and medications reviewed and updated.   Current Outpatient Medications:  .  atenolol (TENORMIN) 50 MG tablet, Take 1 tablet (50 mg total) by mouth daily., Disp: 90 tablet, Rfl: 1 .  gabapentin (NEURONTIN) 100 MG capsule, Take 1 capsule (100 mg total) by mouth 2 (two) times daily., Disp: 60 capsule, Rfl: 4 .  lisinopril (PRINIVIL,ZESTRIL) 20 MG tablet, Take 1 tablet (20 mg total) by mouth daily., Disp: 90 tablet, Rfl: 1 .  metFORMIN (GLUCOPHAGE-XR) 500 MG 24 hr tablet, TAKE 1 TABLET BY MOUTH ONCE DAILY WITH BREAKFAST, Disp: 90 tablet, Rfl: 1 .  simvastatin (ZOCOR) 20 MG tablet, Take 1 tablet (20 mg total) by mouth at bedtime., Disp: 90 tablet, Rfl: 1   Review of Systems  Constitutional: Negative for appetite change, chills, diaphoresis, fatigue, fever and unexpected weight change.  HENT: Positive for dental problem. Negative for congestion, drooling, ear pain, facial swelling, hearing loss, mouth sores, sneezing, sore throat, trouble swallowing and voice change.   Eyes: Negative for pain, discharge, redness, itching and visual disturbance.  Respiratory: Negative for cough, choking, shortness of breath and wheezing.   Cardiovascular: Negative for chest pain, palpitations and leg swelling.   Gastrointestinal: Negative for abdominal pain, blood in stool, constipation, diarrhea and vomiting.  Endocrine: Negative for cold intolerance, heat intolerance and polydipsia.  Genitourinary: Negative for decreased urine volume, dysuria and hematuria.  Musculoskeletal: Negative for arthralgias, back pain and gait problem.  Skin: Negative for rash.  Allergic/Immunologic: Negative for environmental allergies.  Neurological: Negative for seizures, syncope, light-headedness and headaches.  Hematological: Negative for adenopathy.  Psychiatric/Behavioral: Negative for agitation, dysphoric mood and suicidal ideas. The patient is not nervous/anxious.     Per HPI unless specifically indicated above     Objective:    BP 109/65 (BP Location: Right Arm, Patient Position: Sitting, Cuff Size: Large)   Pulse (!) 58   Temp (!) 97.5 F (36.4 C) (Other (Comment))   Ht 5\' 11"  (1.803 m)   Wt (!) 354 lb 8 oz (160.8 kg)   SpO2 95%   BMI 49.44 kg/m   Wt Readings from Last 3 Encounters:  07/11/18 (!) 354 lb 8 oz (160.8 kg)  07/04/18 (!) 361 lb (163.7 kg)  06/13/18 (!) 361 lb (163.7 kg)    Physical Exam Vitals signs reviewed.  Constitutional:      Appearance: He is well-developed.  HENT:     Head: Normocephalic and atraumatic.  Neck:     Musculoskeletal: Neck supple.  Cardiovascular:     Rate and Rhythm: Normal rate and regular rhythm.  Pulmonary:     Effort: Pulmonary effort is normal.     Breath sounds: Normal breath sounds. No  wheezing.  Abdominal:     General: Bowel sounds are normal.     Palpations: Abdomen is soft.     Tenderness: There is no abdominal tenderness.  Lymphadenopathy:     Cervical: No cervical adenopathy.  Skin:    General: Skin is warm and dry.  Neurological:     Mental Status: He is alert and oriented to person, place, and time.  Psychiatric:        Behavior: Behavior normal.     Results for orders placed or performed during the hospital encounter of 07/05/18   Lipid panel  Result Value Ref Range   Cholesterol 140 0 - 200 mg/dL   Triglycerides 76 <150 mg/dL   HDL 35 (L) >40 mg/dL   Total CHOL/HDL Ratio 4.0 RATIO   VLDL 15 0 - 40 mg/dL   LDL Cholesterol 90 0 - 99 mg/dL  Comprehensive metabolic panel  Result Value Ref Range   Sodium 138 135 - 145 mmol/L   Potassium 4.2 3.5 - 5.1 mmol/L   Chloride 105 98 - 111 mmol/L   CO2 26 22 - 32 mmol/L   Glucose, Bld 137 (H) 70 - 99 mg/dL   BUN 8 6 - 20 mg/dL   Creatinine, Ser 0.88 0.61 - 1.24 mg/dL   Calcium 8.6 (L) 8.9 - 10.3 mg/dL   Total Protein 6.8 6.5 - 8.1 g/dL   Albumin 3.6 3.5 - 5.0 g/dL   AST 16 15 - 41 U/L   ALT 26 0 - 44 U/L   Alkaline Phosphatase 75 38 - 126 U/L   Total Bilirubin 0.6 0.3 - 1.2 mg/dL   GFR calc non Af Amer >60 >60 mL/min   GFR calc Af Amer >60 >60 mL/min   Anion gap 7 5 - 15  Hemoglobin A1c  Result Value Ref Range   Hgb A1c MFr Bld 6.8 (H) 4.8 - 5.6 %   Mean Plasma Glucose 148.46 mg/dL      Assessment & Plan:   Encounter Diagnoses  Name Primary?  . Controlled diabetes mellitus type 2 with complications, unspecified whether long term insulin use ([place]) Yes  . Essential hypertension   . Hyperlipidemia, unspecified hyperlipidemia type   . Morbid obesity ([place])   . Tobacco use disorder      -Reviewed labs with pt -pt counseled to continue weight loss efforts -continue current medications -pt to follow up in 3 months.  RTO sooner prn

## 2018-08-19 ENCOUNTER — Ambulatory Visit: Payer: Self-pay | Admitting: Nutrition

## 2018-08-24 ENCOUNTER — Other Ambulatory Visit: Payer: Self-pay | Admitting: Physician Assistant

## 2018-08-28 ENCOUNTER — Encounter: Payer: Self-pay | Admitting: Nutrition

## 2018-08-28 ENCOUNTER — Encounter: Payer: Self-pay | Attending: Physician Assistant | Admitting: Nutrition

## 2018-08-28 ENCOUNTER — Other Ambulatory Visit: Payer: Self-pay

## 2018-08-28 VITALS — Ht 74.0 in | Wt 345.6 lb

## 2018-08-28 DIAGNOSIS — E118 Type 2 diabetes mellitus with unspecified complications: Secondary | ICD-10-CM | POA: Insufficient documentation

## 2018-08-28 DIAGNOSIS — E1165 Type 2 diabetes mellitus with hyperglycemia: Secondary | ICD-10-CM | POA: Insufficient documentation

## 2018-08-28 DIAGNOSIS — IMO0002 Reserved for concepts with insufficient information to code with codable children: Secondary | ICD-10-CM

## 2018-08-28 NOTE — Progress Notes (Signed)
Phone visit 08-28-18  Medical Nutrition Therapy:  Appt start time: 0800 end time:  0815 Assessment:  Primary concerns today: Diabetes and Morbid Obesity.  Is now meal planning. Is eating meals balanced.  Eating more vegetables and more higher fiber foods. Lost 10 lbs.  Only drinking more water Engaged and motivated and making lifestyle changes.  Metformin ER 500 mg BID. Walking daily. PCP visit May 2002  Lab Results  Component Value Date   HGBA1C 6.8 (H) 07/05/2018      CMP Latest Ref Rng & Units 07/05/2018 03/29/2018 01/01/2018  Glucose 70 - 99 mg/dL 137(H) 117(H) 158(H)  BUN 6 - 20 mg/dL 8 9 8   Creatinine 0.61 - 1.24 mg/dL 0.88 0.85 0.99  Sodium 135 - 145 mmol/L 138 138 139  Potassium 3.5 - 5.1 mmol/L 4.2 4.4 3.7  Chloride 98 - 111 mmol/L 105 105 104  CO2 22 - 32 mmol/L 26 27 25   Calcium 8.9 - 10.3 mg/dL 8.6(L) 8.9 8.8(L)  Total Protein 6.5 - 8.1 g/dL 6.8 7.4 6.8  Total Bilirubin 0.3 - 1.2 mg/dL 0.6 0.6 0.5  Alkaline Phos 38 - 126 U/L 75 82 81  AST 15 - 41 U/L 16 14(L) 16  ALT 0 - 44 U/L 26 22 23    Lab Results  Component Value Date   HGBA1C 6.8 (H) 07/05/2018    Preferred Learning Style:   No preference indicated   Learning Readiness:   Ready  Change in progress  MEDICATIONS:   DIETARY INTAKE:   B) Eggs, banana Lunch  [employer] bread, blackforest ham and mustard,  SF tea Dinner .  Meat and lima beans, potatoes, green beans SF tea Flavored. water  Usual physical activity: ADL  Estimated energy needs: 1800  calories 200  g carbohydrates 135 g protein 50 g fat  Progress Towards Goal(s):  In progress.   Nutritional Diagnosis:  NB-1.1 Food and nutrition-related knowledge deficit As related to DM and Morbid Obesity.  As evidenced by A1C 6.5% and BMI 49..    Intervention:  Nutrition and Diabetes education provided on My Plate, CHO counting, meal planning, portion sizes, timing of meals, avoiding snacks between meals unless having a low blood sugar, target ranges for  A1C and blood sugars, signs/symptoms and treatment of hyper/hypoglycemia, monitoring blood sugars, taking medications as prescribed, benefits of exercising 30 minutes per day and prevention of complications of DM.   Goals Keep up the great job. Increase water intake Walk 45-60 minutes 3-4 times per week Lose 3-4 per week. Keeping focus on high fiber foods, and vegetables.   Teaching Method Utilized: none Visual Auditory Hands on  Handouts given during visit include:  The Plate Method   Meal Plan Card    Barriers to learning/adherence to lifestyle change: none  Demonstrated degree of understanding via:  Teach Back   Monitoring/Evaluation:  Dietary intake, exercise, meal planning, and body weight in 1 month(s)..Encouraged him to find a part time job. Need to follow up  on labs and A1C.

## 2018-08-28 NOTE — Patient Instructions (Signed)
  Goals Keep up the great job. Increase water intake Walk 45-60 minutes 3-4 times per week Lose 3-4 per week. Keeping focus on high fiber foods, and vegetables.

## 2018-10-09 ENCOUNTER — Encounter: Payer: Self-pay | Admitting: Physician Assistant

## 2018-10-09 ENCOUNTER — Ambulatory Visit: Payer: Self-pay | Admitting: Physician Assistant

## 2018-10-09 VITALS — BP 129/83 | Wt 345.0 lb

## 2018-10-09 DIAGNOSIS — J302 Other seasonal allergic rhinitis: Secondary | ICD-10-CM

## 2018-10-09 DIAGNOSIS — F17200 Nicotine dependence, unspecified, uncomplicated: Secondary | ICD-10-CM

## 2018-10-09 DIAGNOSIS — E118 Type 2 diabetes mellitus with unspecified complications: Secondary | ICD-10-CM

## 2018-10-09 DIAGNOSIS — I10 Essential (primary) hypertension: Secondary | ICD-10-CM

## 2018-10-09 DIAGNOSIS — E785 Hyperlipidemia, unspecified: Secondary | ICD-10-CM

## 2018-10-09 NOTE — Progress Notes (Signed)
BP 129/83   Wt (!) 345 lb (156.5 kg)   BMI 44.30 kg/m    Subjective:    Patient ID: Thomas Myers, male    DOB: Sep 17, 1970, 48 y.o.   MRN: [id number]  HPI: Thomas Myers is a 48 y.o. male presenting on 10/09/2018 for No chief complaint on file.   HPI   This is a telemedicine visit through [employer] due to the coronavirus pandemic  I connected with  [name] on 10/09/18 by a video enabled telemedicine application and verified that I am speaking with the correct person using two identifiers.   I discussed the limitations of evaluation and management by telemedicine. The patient expressed understanding and agreed to proceed.   Pt says he is doing well.  He is following cdc guidelines and staying at home except when he needs to go to the store and he wears a mask when he goes to the store.    The zyrtec he is taking is working well for his seasonal allergies.  He says he is walking regularly.  He says he is having no swelling of the legs.  He is still smoking.  He has no complaints today.   Relevant past medical, surgical, family and social history reviewed and updated as indicated. Interim medical history since our last visit reviewed. Allergies and medications reviewed and updated.    Current Outpatient Medications:  .  atenolol (TENORMIN) 50 MG tablet, Take 1 tablet (50 mg total) by mouth daily., Disp: 90 tablet, Rfl: 1 .  cetirizine (ZYRTEC) 10 MG tablet, Take 10 mg by mouth daily., Disp: , Rfl:  .  gabapentin (NEURONTIN) 100 MG capsule, Take 1 capsule by mouth twice daily, Disp: 60 capsule, Rfl: 3 .  lisinopril (PRINIVIL,ZESTRIL) 20 MG tablet, Take 1 tablet (20 mg total) by mouth daily., Disp: 90 tablet, Rfl: 1 .  metFORMIN (GLUCOPHAGE-XR) 500 MG 24 hr tablet, TAKE 1 TABLET BY MOUTH ONCE DAILY WITH BREAKFAST, Disp: 90 tablet, Rfl: 1 .  simvastatin (ZOCOR) 20 MG tablet, TAKE 1 TABLET BY MOUTH AT BEDTIME, Disp: 90 tablet, Rfl: 0      Review of Systems  Per HPI  unless specifically indicated above     Objective:    BP 129/83   Wt (!) 345 lb (156.5 kg)   BMI 44.30 kg/m   Wt Readings from Last 3 Encounters:  10/09/18 (!) 345 lb (156.5 kg)  08/28/18 (!) 345 lb 9.6 oz (156.8 kg)  07/11/18 (!) 354 lb 8 oz (160.8 kg)    Pt reported  Wt:  345.3   Lb  And bp:  129/83   Physical Exam Constitutional:      General: He is not in acute distress.    Appearance: He is obese. He is not ill-appearing.  HENT:     Head: Normocephalic and atraumatic.  Pulmonary:     Effort: Pulmonary effort is normal. No respiratory distress.  Neurological:     Mental Status: He is alert and oriented to person, place, and time.  Psychiatric:        Attention and Perception: Attention normal.        Mood and Affect: Mood normal.        Speech: Speech normal.        Behavior: Behavior is cooperative.        Results for orders placed or performed during the hospital encounter of 07/05/18  Lipid panel  Result Value Ref Range   Cholesterol 140  0 - 200 mg/dL   Triglycerides 76 <150 mg/dL   HDL 35 (L) >40 mg/dL   Total CHOL/HDL Ratio 4.0 RATIO   VLDL 15 0 - 40 mg/dL   LDL Cholesterol 90 0 - 99 mg/dL  Comprehensive metabolic panel  Result Value Ref Range   Sodium 138 135 - 145 mmol/L   Potassium 4.2 3.5 - 5.1 mmol/L   Chloride 105 98 - 111 mmol/L   CO2 26 22 - 32 mmol/L   Glucose, Bld 137 (H) 70 - 99 mg/dL   BUN 8 6 - 20 mg/dL   Creatinine, Ser 0.88 0.61 - 1.24 mg/dL   Calcium 8.6 (L) 8.9 - 10.3 mg/dL   Total Protein 6.8 6.5 - 8.1 g/dL   Albumin 3.6 3.5 - 5.0 g/dL   AST 16 15 - 41 U/L   ALT 26 0 - 44 U/L   Alkaline Phosphatase 75 38 - 126 U/L   Total Bilirubin 0.6 0.3 - 1.2 mg/dL   GFR calc non Af Amer >60 >60 mL/min   GFR calc Af Amer >60 >60 mL/min   Anion gap 7 5 - 15  Hemoglobin A1c  Result Value Ref Range   Hgb A1c MFr Bld 6.8 (H) 4.8 - 5.6 %   Mean Plasma Glucose 148.46 mg/dL      Assessment & Plan:    Encounter Diagnoses  Name Primary?   . Controlled diabetes mellitus type 2 with complications, unspecified whether long term insulin use ([place]) Yes  . Essential hypertension   . Hyperlipidemia, unspecified hyperlipidemia type   . Morbid obesity ([place])   . Tobacco use disorder   . Seasonal allergies     -Pt encouraged to continue regular exercise -will continue current medications -Will defer routine labs -encouraged smoking cessation -Pt to follow up in 3 months.  He will contact office sooner prn

## 2018-10-11 ENCOUNTER — Other Ambulatory Visit: Payer: Self-pay | Admitting: Physician Assistant

## 2018-11-18 ENCOUNTER — Telehealth: Payer: Self-pay | Admitting: Student

## 2018-11-18 ENCOUNTER — Other Ambulatory Visit: Payer: Self-pay | Admitting: Physician Assistant

## 2018-11-18 MED ORDER — METFORMIN HCL 500 MG PO TABS: 500.0000 mg | ORAL_TABLET | Freq: Two times a day (BID) | ORAL | 4 refills | Status: AC

## 2018-11-18 NOTE — Telephone Encounter (Signed)
LPN informed pt that the Metformin XR that he is on has been recalled, so PA will send new rx for metformin BID in place of it. Pt verbalized understanding and requests rx to be sent to [facility]. rx was escribed to [facility] in [place] on 11-18-18.

## 2018-11-19 ENCOUNTER — Telehealth: Payer: Self-pay | Admitting: Family

## 2019-01-01 ENCOUNTER — Encounter: Payer: Self-pay | Admitting: Physician Assistant

## 2019-01-01 ENCOUNTER — Ambulatory Visit: Payer: Self-pay | Admitting: Physician Assistant

## 2019-01-01 VITALS — BP 122/85 | Wt 348.0 lb

## 2019-01-01 DIAGNOSIS — E118 Type 2 diabetes mellitus with unspecified complications: Secondary | ICD-10-CM

## 2019-01-01 DIAGNOSIS — E785 Hyperlipidemia, unspecified: Secondary | ICD-10-CM

## 2019-01-01 DIAGNOSIS — F17200 Nicotine dependence, unspecified, uncomplicated: Secondary | ICD-10-CM

## 2019-01-01 DIAGNOSIS — I10 Essential (primary) hypertension: Secondary | ICD-10-CM

## 2019-01-01 NOTE — Progress Notes (Signed)
BP 122/85   Wt (!) 348 lb (157.9 kg)   BMI 44.68 kg/m    Subjective:    Patient ID: Thomas Myers, male    DOB: March 09, 1971, 48 y.o.   MRN: [id number]  HPI: Thomas Myers is a 48 y.o. male presenting on 01/01/2019 for No chief complaint on file.   HPI   This is a telemedicine visit through [employer] due to the coronavirus pandemic   I connected with  Thomas Myers on 01/01/19 by a video enabled telemedicine application and verified that I am speaking with the correct person using two identifiers.   I discussed the limitations of evaluation and management by telemedicine. The patient expressed understanding and agreed to proceed.  Pt is at home.  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    Pt did not get his labs drawn.  He is doing well.    He is walking on his driveway some.  He has no complaints today.   He is still smoking.   He is monitoring his BP at home.  The recorded bp for today is what he got at his home.     Relevant past medical, surgical, family and social history reviewed and updated as indicated. Interim medical history since our last visit reviewed. Allergies and medications reviewed and updated.    Current Outpatient Medications:  .  atenolol (TENORMIN) 50 MG tablet, Take 1 tablet by mouth once daily, Disp: 90 tablet, Rfl: 1 .  gabapentin (NEURONTIN) 100 MG capsule, Take 1 capsule by mouth twice daily, Disp: 60 capsule, Rfl: 3 .  lisinopril (PRINIVIL,ZESTRIL) 20 MG tablet, Take 1 tablet (20 mg total) by mouth daily., Disp: 90 tablet, Rfl: 1 .  metFORMIN (GLUCOPHAGE) 500 MG tablet, Take 1 tablet (500 mg total) by mouth 2 (two) times daily with a meal., Disp: 60 tablet, Rfl: 4 .  simvastatin (ZOCOR) 20 MG tablet, TAKE 1 TABLET BY MOUTH AT BEDTIME, Disp: 90 tablet, Rfl: 0 .  cetirizine (ZYRTEC) 10 MG tablet, Take 10 mg by mouth daily., Disp: , Rfl:    Review of Systems  Per HPI unless specifically indicated above     Objective:    BP 122/85   Wt (!) 348 lb (157.9  kg)   BMI 44.68 kg/m   Wt Readings from Last 3 Encounters:  01/01/19 (!) 348 lb (157.9 kg)  10/09/18 (!) 345 lb (156.5 kg)  08/28/18 (!) 345 lb 9.6 oz (156.8 kg)    Physical Exam Constitutional:      General: He is not in acute distress.    Appearance: Normal appearance. He is obese. He is not ill-appearing.  HENT:     Head: Normocephalic and atraumatic.  Pulmonary:     Effort: Pulmonary effort is normal. No respiratory distress.  Neurological:     Mental Status: He is alert and oriented to person, place, and time.  Psychiatric:        Attention and Perception: Attention normal.        Speech: Speech normal.        Behavior: Behavior is cooperative.            Assessment & Plan:   Encounter Diagnoses  Name Primary?  . Controlled diabetes mellitus type 2 with complications, unspecified whether long term insulin use ([place]) Yes  . Essential hypertension   . Hyperlipidemia, unspecified hyperlipidemia type   . Morbid obesity ([place])   . Tobacco use disorder       -pt to Get  labs drawn.  He will be called with results -refer for annual DM eye exam -no changes to medications today -pt counseled on increasing walking up to goal of 45 minutes 5 days/week -encouraged smoking cessation -pt to follow up 3 months.  He is to contact office sooner prn

## 2019-01-05 ENCOUNTER — Other Ambulatory Visit: Payer: Self-pay | Admitting: Physician Assistant

## 2019-01-06 ENCOUNTER — Other Ambulatory Visit (HOSPITAL_COMMUNITY)
Admission: RE | Admit: 2019-01-06 | Discharge: 2019-01-06 | Disposition: A | Discharge status: Home / Self Care | Payer: Self-pay | Source: Ambulatory Visit | Attending: Physician Assistant | Admitting: Physician Assistant

## 2019-01-06 ENCOUNTER — Ambulatory Visit: Payer: Self-pay | Admitting: Physician Assistant

## 2019-01-06 DIAGNOSIS — E785 Hyperlipidemia, unspecified: Secondary | ICD-10-CM | POA: Insufficient documentation

## 2019-01-06 DIAGNOSIS — I10 Essential (primary) hypertension: Secondary | ICD-10-CM | POA: Insufficient documentation

## 2019-01-06 DIAGNOSIS — E118 Type 2 diabetes mellitus with unspecified complications: Secondary | ICD-10-CM | POA: Insufficient documentation

## 2019-01-06 LAB — COMPREHENSIVE METABOLIC PANEL
ALT: 26 U/L (ref 0–44)
AST: 16 U/L (ref 15–41)
Albumin: 3.3 g/dL — ABNORMAL LOW (ref 3.5–5.0)
Alkaline Phosphatase: 72 U/L (ref 38–126)
Anion gap: 8 (ref 5–15)
BUN: 6 mg/dL (ref 6–20)
CO2: 23 mmol/L (ref 22–32)
Calcium: 8.6 mg/dL — ABNORMAL LOW (ref 8.9–10.3)
Chloride: 105 mmol/L (ref 98–111)
Creatinine, Ser: 0.87 mg/dL (ref 0.61–1.24)
GFR calc Af Amer: >60 mL/min (ref >60)
GFR calc non Af Amer: >60 mL/min (ref >60)
Glucose, Bld: 115 mg/dL — ABNORMAL HIGH (ref 70–99)
Potassium: 4.1 mmol/L (ref 3.5–5.1)
Sodium: 136 mmol/L (ref 135–145)
Total Bilirubin: 0.6 mg/dL (ref 0.3–1.2)
Total Protein: 6.6 g/dL (ref 6.5–8.1)

## 2019-01-06 LAB — HEMOGLOBIN A1C
Hgb A1c MFr Bld: 6.5 % — ABNORMAL HIGH (ref 4.8–5.6)
Mean Plasma Glucose: 139.85 mg/dL

## 2019-01-06 LAB — LIPID PANEL
Cholesterol: 135 mg/dL (ref 0–200)
HDL: 30 mg/dL — ABNORMAL LOW (ref >40)
LDL Cholesterol: 90 mg/dL (ref 0–99)
Total CHOL/HDL Ratio: 4.5 RATIO
Triglycerides: 76 mg/dL (ref <150)
VLDL: 15 mg/dL (ref 0–40)

## 2019-01-20 ENCOUNTER — Other Ambulatory Visit: Payer: Self-pay | Admitting: Physician Assistant

## 2019-01-20 DIAGNOSIS — I10 Essential (primary) hypertension: Secondary | ICD-10-CM

## 2019-01-20 DIAGNOSIS — E118 Type 2 diabetes mellitus with unspecified complications: Secondary | ICD-10-CM

## 2019-01-20 DIAGNOSIS — E785 Hyperlipidemia, unspecified: Secondary | ICD-10-CM

## 2019-01-26 ENCOUNTER — Other Ambulatory Visit: Payer: Self-pay | Admitting: Physician Assistant

## 2019-01-28 ENCOUNTER — Encounter: Payer: Self-pay | Admitting: Nutrition

## 2019-01-28 ENCOUNTER — Encounter: Payer: Self-pay | Attending: Physician Assistant | Admitting: Nutrition

## 2019-01-28 ENCOUNTER — Other Ambulatory Visit: Payer: Self-pay

## 2019-01-28 NOTE — Progress Notes (Signed)
Phone visit 01/28/19  Medical Nutrition Therapy:  Appt start time: 0930end time:  1000 Assessment:  Primary concerns today: Diabetes and Morbid Obesity. Thomas Myers been doing more walking. Still on Metformin 500 mg BID.  Not testing blood sugars. A1C up to 6.5% from 6%. Says he is eating fruits and veggies and walking some. Skips lunch often.  Lost 2 lbs since last visit.  Should be losing more weight and BS should be going down if he is following the recommendations.  Needs to be more consistent in changes and exercise. He needs to start testing blood sugars to be more aware of eating habits. Needs to get back to using a food and step tracker.  Lab Results  Component Value Date   HGBA1C 6.5 (H) 01/06/2019      CMP Latest Ref Rng & Units 01/06/2019 07/05/2018 03/29/2018  Glucose 70 - 99 mg/dL 115(H) 137(H) 117(H)  BUN 6 - 20 mg/dL 6 8 9   Creatinine 0.61 - 1.24 mg/dL 0.87 0.88 0.85  Sodium 135 - 145 mmol/L 136 138 138  Potassium 3.5 - 5.1 mmol/L 4.1 4.2 4.4  Chloride 98 - 111 mmol/L 105 105 105  CO2 22 - 32 mmol/L 23 26 27   Calcium 8.9 - 10.3 mg/dL 8.6(L) 8.6(L) 8.9  Total Protein 6.5 - 8.1 g/dL 6.6 6.8 7.4  Total Bilirubin 0.3 - 1.2 mg/dL 0.6 0.6 0.6  Alkaline Phos 38 - 126 U/L 72 75 82  AST 15 - 41 U/L 16 16 14(L)  ALT 0 - 44 U/L 26 26 22    Lab Results  Component Value Date   HGBA1C 6.5 (H) 01/06/2019    Preferred Learning Style:   No preference indicated   Learning Readiness:   Ready  Change in progress  MEDICATIONS:   DIETARY INTAKE:   B) Eggs, banana Lunch  [employer] bread, blackforest ham and mustard,  SF tea Dinner .  Meat and lima beans, potatoes, green beans SF tea Flavored. water  Usual physical activity: ADL  Estimated energy needs: 1800  calories 200  g carbohydrates 135 g protein 50 g fat  Progress Towards Goal(s):  In progress.   Nutritional Diagnosis:  NB-1.1 Food and nutrition-related knowledge deficit As related to DM and Morbid Obesity.  As evidenced by  A1C 6.5% and BMI 49..    Intervention:  Nutrition and Diabetes education provided on My Plate, CHO counting, meal planning, portion sizes, timing of meals, avoiding snacks between meals unless having a low blood sugar, target ranges for A1C and blood sugars, signs/symptoms and treatment of hyper/hypoglycemia, monitoring blood sugars, taking medications as prescribed, benefits of exercising 30 minutes per day and prevention of complications of DM.   Goals Test blood sugars twice a day; before breakkfast and before bed Don't eat snacks between meals or after 7 pm. Increase water intake-- drink a gallon of water Walk 45  minutes-4 times per week Keeping focus on high fiber foods, and vegetables. Don't skip meals. Lose 5 lbs per month. Get A1C to 6% or less.   Teaching Method Utilized: none Visual Auditory Hands on  Handouts given during visit include:  The Plate Method   Meal Plan Card    Barriers to learning/adherence to lifestyle change: none  Demonstrated degree of understanding via:  Teach Back   Monitoring/Evaluation:  Dietary intake, exercise, meal planning, and body weight in 3 months.

## 2019-01-28 NOTE — Patient Instructions (Signed)
Goals Test blood sugars twice a day; before breakkfast and before bed Don't eat snacks between meals or after 7 pm. Increase water intake-- drink a gallon of water Walk 45  minutes-4 times per week Keeping focus on high fiber foods, and vegetables. Don't skip meals. Lose 5 lbs per month. Get A1C to 6% or less.

## 2019-01-29 ENCOUNTER — Telehealth: Payer: Self-pay | Admitting: Family

## 2019-02-24 ENCOUNTER — Ambulatory Visit: Payer: Self-pay | Admitting: Physician Assistant

## 2019-03-09 ENCOUNTER — Other Ambulatory Visit: Payer: Self-pay | Admitting: Physician Assistant

## 2019-04-07 ENCOUNTER — Other Ambulatory Visit (HOSPITAL_COMMUNITY)
Admission: RE | Admit: 2019-04-07 | Discharge: 2019-04-07 | Disposition: A | Discharge status: Home / Self Care | Payer: Self-pay | Source: Ambulatory Visit | Attending: Physician Assistant | Admitting: Physician Assistant

## 2019-04-07 DIAGNOSIS — E785 Hyperlipidemia, unspecified: Secondary | ICD-10-CM | POA: Insufficient documentation

## 2019-04-07 DIAGNOSIS — E118 Type 2 diabetes mellitus with unspecified complications: Secondary | ICD-10-CM | POA: Insufficient documentation

## 2019-04-07 DIAGNOSIS — I10 Essential (primary) hypertension: Secondary | ICD-10-CM | POA: Insufficient documentation

## 2019-04-07 LAB — COMPREHENSIVE METABOLIC PANEL
ALT: 27 U/L (ref 0–44)
AST: 18 U/L (ref 15–41)
Albumin: 3.6 g/dL (ref 3.5–5.0)
Alkaline Phosphatase: 76 U/L (ref 38–126)
Anion gap: 8 (ref 5–15)
BUN: 11 mg/dL (ref 6–20)
CO2: 25 mmol/L (ref 22–32)
Calcium: 8.8 mg/dL — ABNORMAL LOW (ref 8.9–10.3)
Chloride: 104 mmol/L (ref 98–111)
Creatinine, Ser: 1.02 mg/dL (ref 0.61–1.24)
GFR calc Af Amer: >60 mL/min (ref >60)
GFR calc non Af Amer: >60 mL/min (ref >60)
Glucose, Bld: 104 mg/dL — ABNORMAL HIGH (ref 70–99)
Potassium: 4.3 mmol/L (ref 3.5–5.1)
Sodium: 137 mmol/L (ref 135–145)
Total Bilirubin: 0.7 mg/dL (ref 0.3–1.2)
Total Protein: 7.1 g/dL (ref 6.5–8.1)

## 2019-04-07 LAB — LIPID PANEL
Cholesterol: 155 mg/dL (ref 0–200)
HDL: 31 mg/dL — ABNORMAL LOW (ref >40)
LDL Cholesterol: 107 mg/dL — ABNORMAL HIGH (ref 0–99)
Total CHOL/HDL Ratio: 5 RATIO
Triglycerides: 86 mg/dL (ref <150)
VLDL: 17 mg/dL (ref 0–40)

## 2019-04-07 LAB — HEMOGLOBIN A1C
Hgb A1c MFr Bld: 6.6 % — ABNORMAL HIGH (ref 4.8–5.6)
Mean Plasma Glucose: 142.72 mg/dL

## 2019-04-08 ENCOUNTER — Ambulatory Visit: Payer: Self-pay | Admitting: Physician Assistant

## 2019-04-08 ENCOUNTER — Encounter: Payer: Self-pay | Admitting: Physician Assistant

## 2019-04-08 VITALS — BP 139/85

## 2019-04-08 DIAGNOSIS — E785 Hyperlipidemia, unspecified: Secondary | ICD-10-CM

## 2019-04-08 DIAGNOSIS — E118 Type 2 diabetes mellitus with unspecified complications: Secondary | ICD-10-CM

## 2019-04-08 DIAGNOSIS — I10 Essential (primary) hypertension: Secondary | ICD-10-CM

## 2019-04-08 DIAGNOSIS — F17200 Nicotine dependence, unspecified, uncomplicated: Secondary | ICD-10-CM

## 2019-04-08 LAB — MICROALBUMIN, URINE: Microalb, Ur: 8.8 ug/mL — ABNORMAL HIGH

## 2019-04-08 NOTE — Progress Notes (Signed)
BP 139/85    Subjective:    Patient ID: Thomas Myers, male    DOB: [birth year], 48 y.o.   MRN: [id number]  HPI: Thomas Myers is a 48 y.o. male presenting on 04/08/2019 for No chief complaint on file.   HPI   This is a telemedicine appointment due to coronavirus pandemic.  It is via telephone as patient was having technical difficulties connecting to [employer] this morning.  I connected with  [name] on 04/08/19 by a video enabled telemedicine application and verified that I am speaking with the correct person using two identifiers.   I discussed the limitations of evaluation and management by telemedicine. The patient expressed understanding and agreed to proceed.  Patient is at home.  Provider is at office.    Patient is a 48 year old male with history of diabetes, hypertension, dyslipiedmia, and obesity.  Pt is very happy that he was able to go to the dentist recently.  Patient says he is doing well.  He has no complaints today.  He is working very hard on smoking cessation.  patient checks his blood pressure at home.  He says it usually runs around 122/81.  He thinks that it might be up a little bit today since he just ran through the house to get his BP machine.    Patient continues to see the dietitian to get assistance with his obesity.  Pt has no complaints today.    Relevant past medical, surgical, family and social history reviewed and updated as indicated. Interim medical history since our last visit reviewed. Allergies and medications reviewed and updated.    Current Outpatient Medications:  .  atenolol (TENORMIN) 50 MG tablet, Take 1 tablet by mouth once daily, Disp: 90 tablet, Rfl: 1 .  gabapentin (NEURONTIN) 100 MG capsule, Take 1 capsule by mouth twice daily, Disp: 60 capsule, Rfl: 1 .  lisinopril (ZESTRIL) 20 MG tablet, Take 1 tablet by mouth once daily, Disp: 90 tablet, Rfl: 1 .  MELATONIN PO, Take by mouth., Disp: , Rfl:  .  metFORMIN  (GLUCOPHAGE) 500 MG tablet, Take 1 tablet (500 mg total) by mouth 2 (two) times daily with a meal., Disp: 60 tablet, Rfl: 4 .  simvastatin (ZOCOR) 20 MG tablet, TAKE 1 TABLET BY MOUTH AT BEDTIME, Disp: 90 tablet, Rfl: 1 .  cetirizine (ZYRTEC) 10 MG tablet, Take 10 mg by mouth daily., Disp: , Rfl:    Review of Systems  Per HPI unless specifically indicated above     Objective:    BP 139/85   Wt Readings from Last 3 Encounters:  01/28/19 (!) 343 lb (155.6 kg)  01/01/19 (!) 348 lb (157.9 kg)  10/09/18 (!) 345 lb (156.5 kg)    Physical Exam Pulmonary:     Effort: Pulmonary effort is normal. No respiratory distress.  Neurological:     Mental Status: He is alert and oriented to person, place, and time.  Psychiatric:        Attention and Perception: Attention normal.        Mood and Affect: Mood normal.        Speech: Speech normal.        Behavior: Behavior is cooperative.       Results for orders placed or performed during the hospital encounter of 04/07/19  Lipid panel  Result Value Ref Range   Cholesterol 155 0 - 200 mg/dL   Triglycerides 86 <150 mg/dL   HDL 31 (L) >40 mg/dL  Total CHOL/HDL Ratio 5.0 RATIO   VLDL 17 0 - 40 mg/dL   LDL Cholesterol 107 (H) 0 - 99 mg/dL  Comprehensive metabolic panel  Result Value Ref Range   Sodium 137 135 - 145 mmol/L   Potassium 4.3 3.5 - 5.1 mmol/L   Chloride 104 98 - 111 mmol/L   CO2 25 22 - 32 mmol/L   Glucose, Bld 104 (H) 70 - 99 mg/dL   BUN 11 6 - 20 mg/dL   Creatinine, Ser 1.02 0.61 - 1.24 mg/dL   Calcium 8.8 (L) 8.9 - 10.3 mg/dL   Total Protein 7.1 6.5 - 8.1 g/dL   Albumin 3.6 3.5 - 5.0 g/dL   AST 18 15 - 41 U/L   ALT 27 0 - 44 U/L   Alkaline Phosphatase 76 38 - 126 U/L   Total Bilirubin 0.7 0.3 - 1.2 mg/dL   GFR calc non Af Amer >60 >60 mL/min   GFR calc Af Amer >60 >60 mL/min   Anion gap 8 5 - 15  Hemoglobin A1c  Result Value Ref Range   Hgb A1c MFr Bld 6.6 (H) 4.8 - 5.6 %   Mean Plasma Glucose 142.72 mg/dL       Assessment & Plan:    1.  Type 2 diabetes  Reviewed labs with patient.  Patient is to continue his current Metformin and diabetic diet.  2.  Hypertension  Patient to continue his lisinopril and atenolol.  3.  Dyslipidemia.  Patient to continue with his simvastatin.  He is counseled to follow a low-fat diet.  4.  Obesity  Patient encouraged to continue to exercise with gradually increasing as tolerated.  He is to continue with dietitian.  5.  Tobacco use disorder  Patient is encouraged to continue efforts to stop smoking.   Patient will follow up in 3 months.  He is to contact the office sooner if needed

## 2019-04-26 ENCOUNTER — Other Ambulatory Visit: Payer: Self-pay | Admitting: Physician Assistant

## 2019-05-05 ENCOUNTER — Ambulatory Visit: Payer: Self-pay | Admitting: Nutrition

## 2019-07-08 ENCOUNTER — Other Ambulatory Visit (HOSPITAL_COMMUNITY)
Admission: RE | Admit: 2019-07-08 | Discharge: 2019-07-08 | Disposition: A | Discharge status: Home / Self Care | Payer: Self-pay | Source: Ambulatory Visit | Attending: Physician Assistant | Admitting: Physician Assistant

## 2019-07-08 DIAGNOSIS — E118 Type 2 diabetes mellitus with unspecified complications: Secondary | ICD-10-CM | POA: Insufficient documentation

## 2019-07-08 DIAGNOSIS — E785 Hyperlipidemia, unspecified: Secondary | ICD-10-CM | POA: Insufficient documentation

## 2019-07-08 DIAGNOSIS — I10 Essential (primary) hypertension: Secondary | ICD-10-CM | POA: Insufficient documentation

## 2019-07-08 LAB — LIPID PANEL
Cholesterol: 191 mg/dL (ref 0–200)
HDL: 40 mg/dL — ABNORMAL LOW (ref >40)
LDL Cholesterol: 130 mg/dL — ABNORMAL HIGH (ref 0–99)
Total CHOL/HDL Ratio: 4.8 RATIO
Triglycerides: 104 mg/dL (ref <150)
VLDL: 21 mg/dL (ref 0–40)

## 2019-07-08 LAB — COMPREHENSIVE METABOLIC PANEL
ALT: 22 U/L (ref 0–44)
AST: 14 U/L — ABNORMAL LOW (ref 15–41)
Albumin: 3.5 g/dL (ref 3.5–5.0)
Alkaline Phosphatase: 75 U/L (ref 38–126)
Anion gap: 8 (ref 5–15)
BUN: 9 mg/dL (ref 6–20)
CO2: 26 mmol/L (ref 22–32)
Calcium: 9 mg/dL (ref 8.9–10.3)
Chloride: 103 mmol/L (ref 98–111)
Creatinine, Ser: 0.82 mg/dL (ref 0.61–1.24)
GFR calc Af Amer: >60 mL/min (ref >60)
GFR calc non Af Amer: >60 mL/min (ref >60)
Glucose, Bld: 102 mg/dL — ABNORMAL HIGH (ref 70–99)
Potassium: 4.2 mmol/L (ref 3.5–5.1)
Sodium: 137 mmol/L (ref 135–145)
Total Bilirubin: 0.5 mg/dL (ref 0.3–1.2)
Total Protein: 6.9 g/dL (ref 6.5–8.1)

## 2019-07-09 ENCOUNTER — Ambulatory Visit: Payer: Self-pay | Admitting: Physician Assistant

## 2019-07-09 ENCOUNTER — Other Ambulatory Visit: Payer: Self-pay

## 2019-07-09 ENCOUNTER — Encounter: Payer: Self-pay | Admitting: Physician Assistant

## 2019-07-09 VITALS — BP 130/90 | HR 75 | Temp 97.5°F

## 2019-07-09 DIAGNOSIS — F4321 Adjustment disorder with depressed mood: Secondary | ICD-10-CM

## 2019-07-09 DIAGNOSIS — E118 Type 2 diabetes mellitus with unspecified complications: Secondary | ICD-10-CM

## 2019-07-09 DIAGNOSIS — E785 Hyperlipidemia, unspecified: Secondary | ICD-10-CM

## 2019-07-09 DIAGNOSIS — I10 Essential (primary) hypertension: Secondary | ICD-10-CM

## 2019-07-09 DIAGNOSIS — F17200 Nicotine dependence, unspecified, uncomplicated: Secondary | ICD-10-CM

## 2019-07-09 LAB — HEMOGLOBIN A1C
Hgb A1c MFr Bld: 6.3 % — ABNORMAL HIGH (ref 4.8–5.6)
Mean Plasma Glucose: 134 mg/dL

## 2019-07-09 NOTE — Progress Notes (Signed)
BP 130/90   Pulse 75   Temp (!) 97.5 F (36.4 C)   SpO2 95%    Subjective:    Patient ID: Thomas Myers, male    DOB: [birth year], 49 y.o.   MRN: [id number]  HPI: Thomas Myers is a 49 y.o. male presenting on 07/09/2019 for Diabetes, Hypertension, and Hyperlipidemia   HPI  The patient had a negative covid 19 screening questionnaire.     Pt is a 61yoM with DM, htn, dyslipidemia who presents to office today for routine follow-up.  Pt is very upset and tearful today.  He is upset about the death of his mother; she died on thanksgiving while in hospital after having had cardiac surgery.  Pt and his mother were very close.  Pt lived with both of his parents until the death of his mother; he is still living with his father.  Pt admits to having a great deal of difficulty trying to come to terms with the death of his mother.  When asked if he is having any other problems, he says he can't think of anything else.     Relevant past medical, surgical, family and social history reviewed and updated as indicated. Interim medical history since our last visit reviewed. Allergies and medications reviewed and updated.   Current Outpatient Medications:  .  atenolol (TENORMIN) 50 MG tablet, Take 1 tablet by mouth once daily, Disp: 90 tablet, Rfl: 0 .  gabapentin (NEURONTIN) 100 MG capsule, Take 1 capsule by mouth twice daily, Disp: 60 capsule, Rfl: 1 .  lisinopril (ZESTRIL) 20 MG tablet, Take 1 tablet by mouth once daily, Disp: 90 tablet, Rfl: 1 .  MELATONIN PO, Take by mouth., Disp: , Rfl:  .  metFORMIN (GLUCOPHAGE) 500 MG tablet, Take 1 tablet (500 mg total) by mouth 2 (two) times daily with a meal., Disp: 60 tablet, Rfl: 4 .  simvastatin (ZOCOR) 20 MG tablet, TAKE 1 TABLET BY MOUTH AT BEDTIME, Disp: 90 tablet, Rfl: 1 .  cetirizine (ZYRTEC) 10 MG tablet, Take 10 mg by mouth daily., Disp: , Rfl:      Review of Systems  Per HPI unless specifically indicated above     Objective:    BP  130/90   Pulse 75   Temp (!) 97.5 F (36.4 C)   SpO2 95%   Wt Readings from Last 3 Encounters:  01/28/19 (!) 343 lb (155.6 kg)  01/01/19 (!) 348 lb (157.9 kg)  10/09/18 (!) 345 lb (156.5 kg)    Physical Exam Vitals reviewed.  Constitutional:      General: He is not in acute distress.    Appearance: He is well-developed. He is obese. He is not ill-appearing.  HENT:     Head: Normocephalic and atraumatic.  Cardiovascular:     Rate and Rhythm: Normal rate and regular rhythm.  Pulmonary:     Effort: Pulmonary effort is normal.     Breath sounds: Normal breath sounds. No wheezing.  Abdominal:     General: Bowel sounds are normal.     Palpations: Abdomen is soft.     Tenderness: There is no abdominal tenderness.  Musculoskeletal:     Cervical back: Neck supple.     Right lower leg: No edema.     Left lower leg: No edema.  Lymphadenopathy:     Cervical: No cervical adenopathy.  Skin:    General: Skin is warm and dry.  Neurological:     Mental Status: He is alert  and oriented to person, place, and time.  Psychiatric:        Attention and Perception: Attention normal.        Mood and Affect: Affect is tearful.        Speech: Speech normal.        Behavior: Behavior is cooperative.     Results for orders placed or performed during the hospital encounter of 07/08/19  Lipid panel  Result Value Ref Range   Cholesterol 191 0 - 200 mg/dL   Triglycerides 104 <150 mg/dL   HDL 40 (L) >40 mg/dL   Total CHOL/HDL Ratio 4.8 RATIO   VLDL 21 0 - 40 mg/dL   LDL Cholesterol 130 (H) 0 - 99 mg/dL  Comprehensive metabolic panel  Result Value Ref Range   Sodium 137 135 - 145 mmol/L   Potassium 4.2 3.5 - 5.1 mmol/L   Chloride 103 98 - 111 mmol/L   CO2 26 22 - 32 mmol/L   Glucose, Bld 102 (H) 70 - 99 mg/dL   BUN 9 6 - 20 mg/dL   Creatinine, Ser 0.82 0.61 - 1.24 mg/dL   Calcium 9.0 8.9 - 10.3 mg/dL   Total Protein 6.9 6.5 - 8.1 g/dL   Albumin 3.5 3.5 - 5.0 g/dL   AST 14 (L) 15 - 41  U/L   ALT 22 0 - 44 U/L   Alkaline Phosphatase 75 38 - 126 U/L   Total Bilirubin 0.5 0.3 - 1.2 mg/dL   GFR calc non Af Amer >60 >60 mL/min   GFR calc Af Amer >60 >60 mL/min   Anion gap 8 5 - 15  Hemoglobin A1c  Result Value Ref Range   Hgb A1c MFr Bld 6.3 (H) 4.8 - 5.6 %   Mean Plasma Glucose 134 mg/dL      Assessment & Plan:   Encounter Diagnoses  Name Primary?  . Grief Yes  . Controlled diabetes mellitus type 2 with complications, unspecified whether long term insulin use ([place])   . Essential hypertension   . Hyperlipidemia, unspecified hyperlipidemia type   . Tobacco use disorder   . Morbid obesity ([place])     -pt urged to Call [facility] for counseling; he is given contact information -reviewed labs with pt  -pt to continue current medications -diabetic foot exam updated -pt to follow up in 3 months.  He is to contact office sooner prn

## 2019-08-09 IMAGING — DX DG ABDOMEN ACUTE W/ 1V CHEST
5 series · 5 of 5 positions shown · non-contrast
Comparison: Chest 02/21/2017

CLINICAL DATA: History of pericarditis. Right-sided chest pressure.
Shortness of breath. Pain now on the upper abdomen. Umbilical
hernia.

EXAM:
DG ABDOMEN ACUTE W/ 1V CHEST

[chest pa]
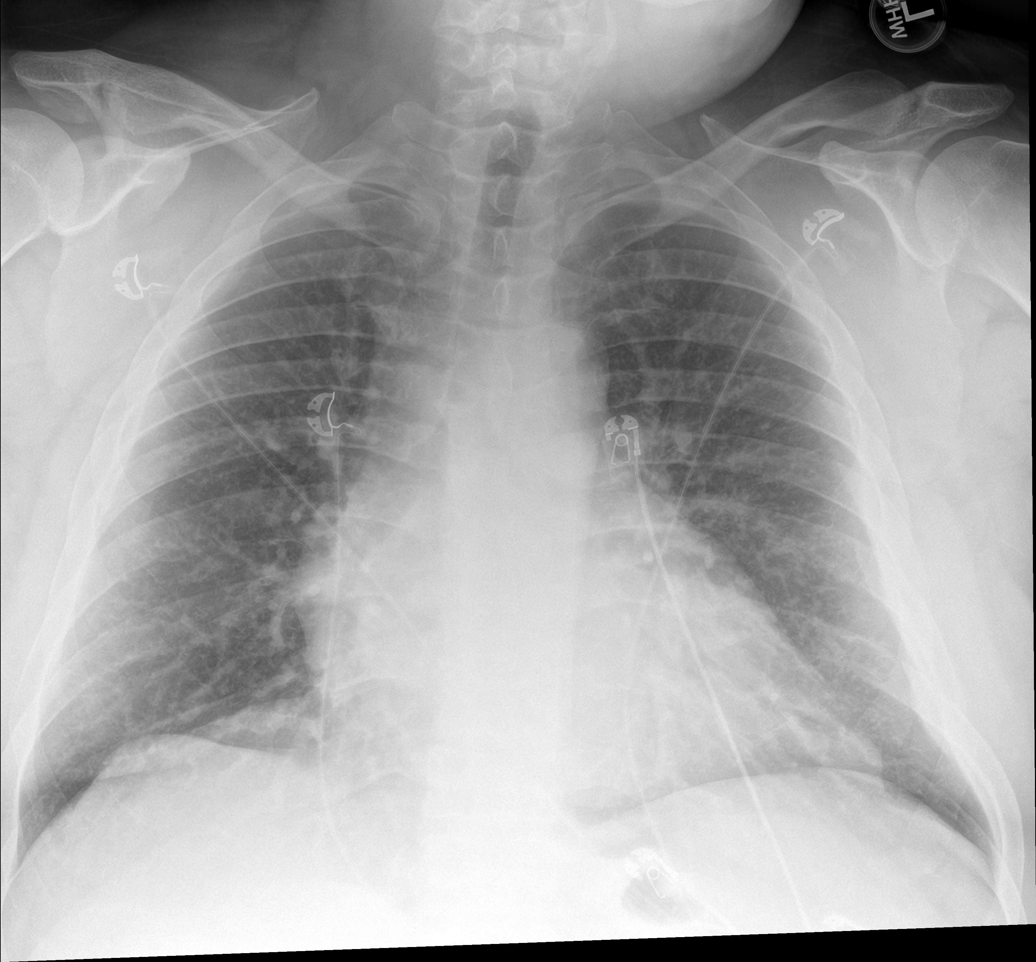

[abdomen erect]
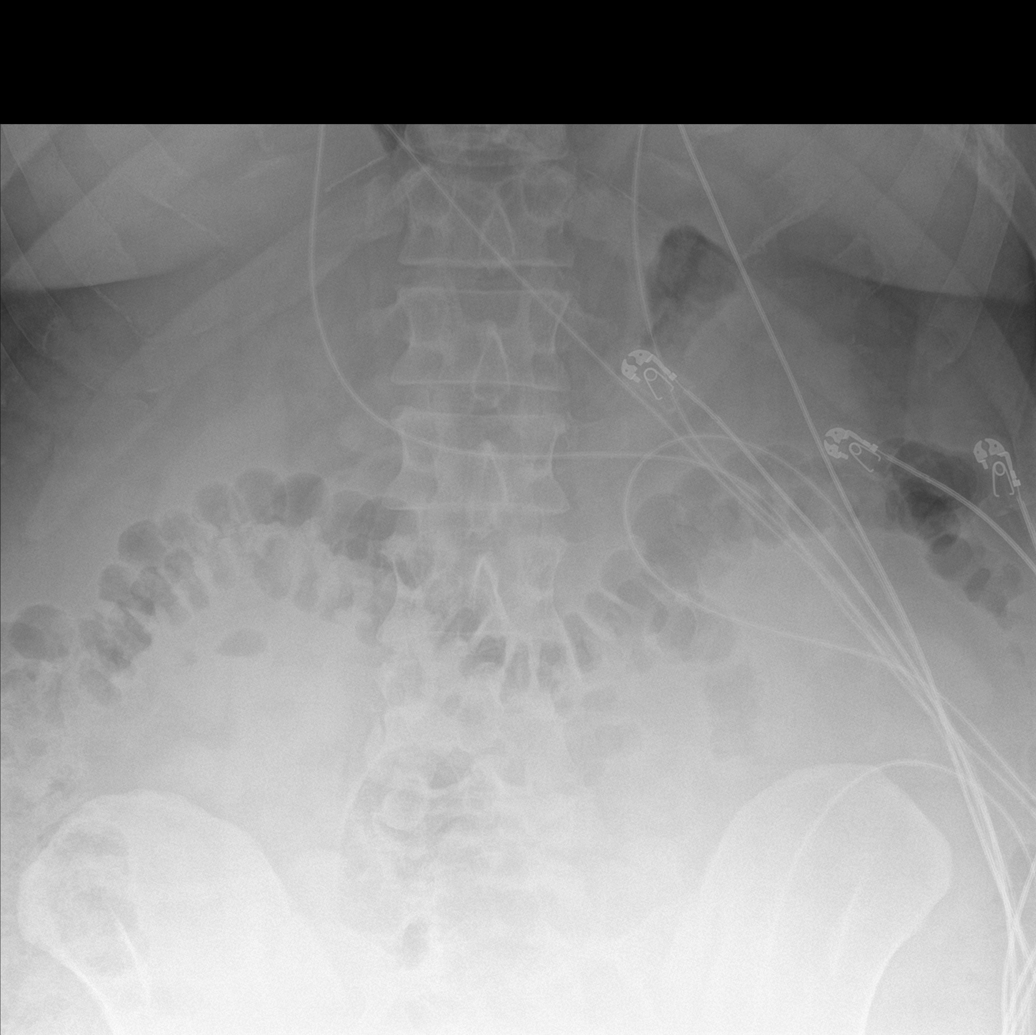

[abdomen supine (1 of 3)]
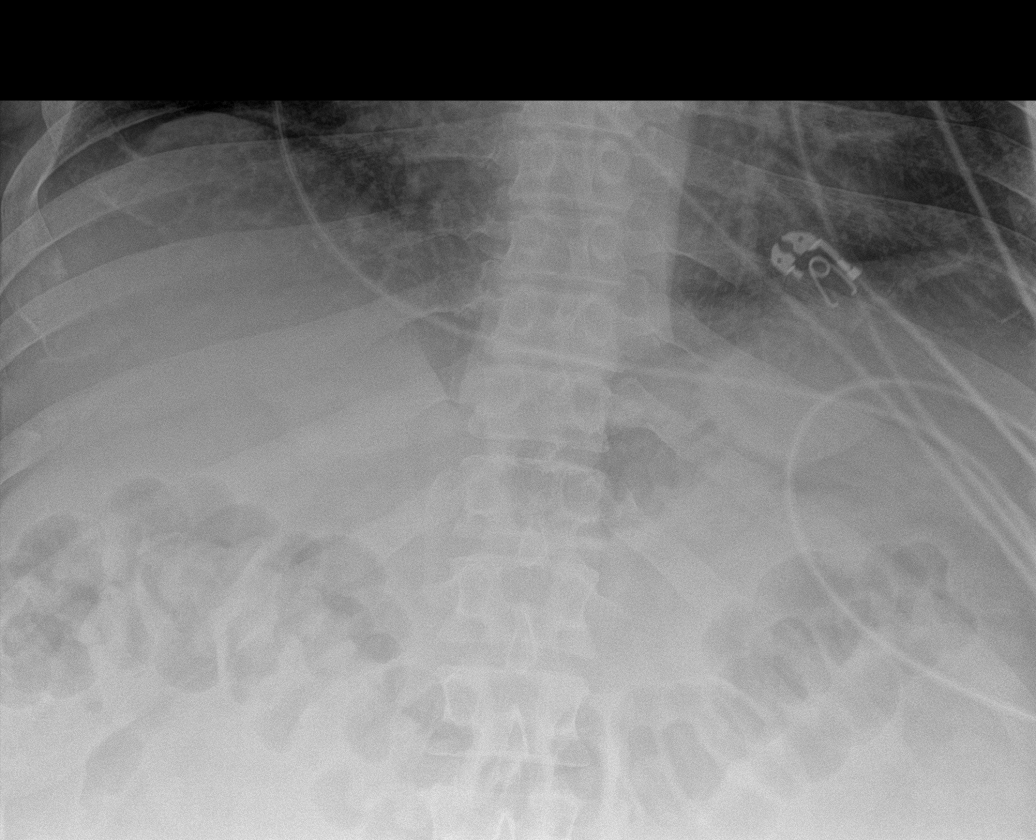

[abdomen supine (2 of 3)]
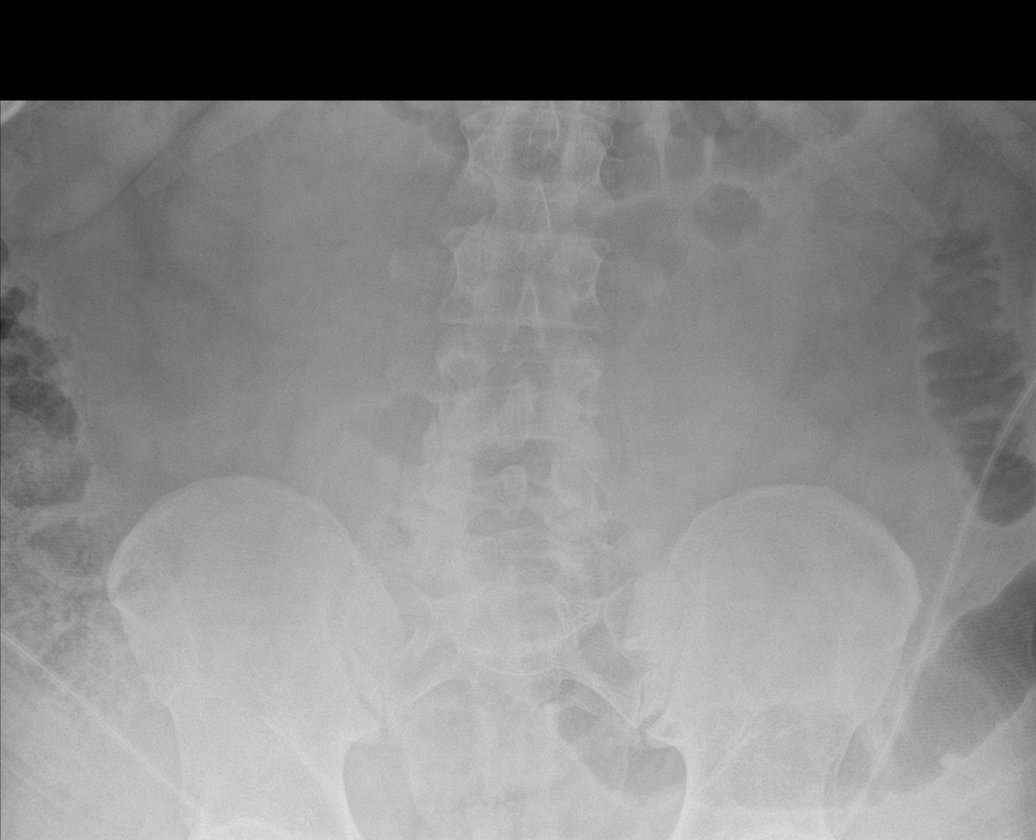

[abdomen supine (3 of 3)]
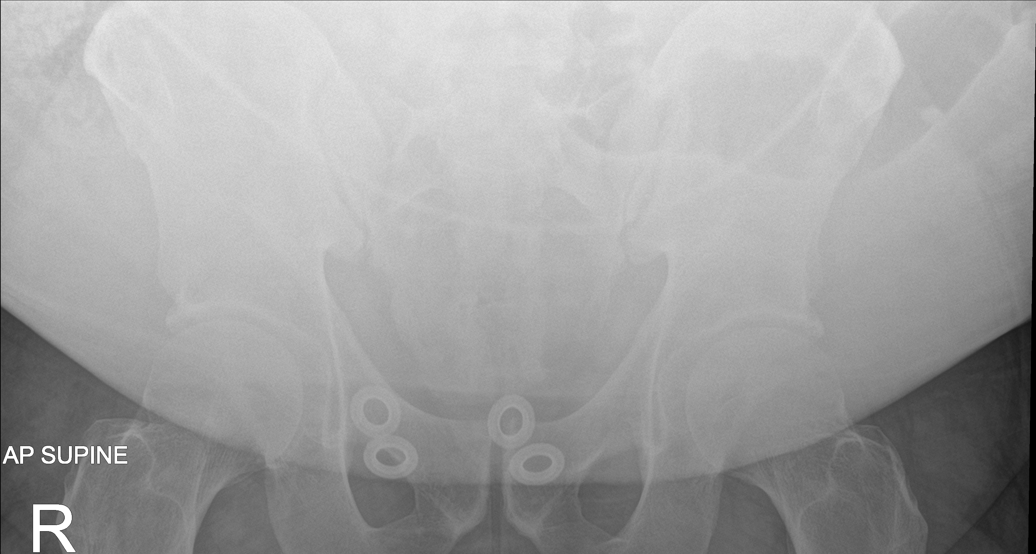

[5 of 5 positions shown; findings below may reference images not displayed]

FINDINGS: Cardiac enlargement. No vascular congestion or edema. No focal
consolidation. No blunting of costophrenic angles. No pneumothorax.

Gas and stool throughout the colon. No small or large bowel
distention. No free intra-abdominal air. No abnormal air-fluid
levels. No radiopaque stones. Visualized bones appear intact.
IMPRESSION: Cardiac enlargement. No evidence of active pulmonary disease.
Nonobstructive bowel gas pattern.

## 2019-08-09 IMAGING — CT CT ANGIO CHEST
4 of 10 series · 16 of 46 positions shown · IV contrast (Isovue)
Comparison: Plain films from earlier in the same day.

CLINICAL DATA: Shortness of breath and chest and abdominal pain,
initial encounter

EXAM:
CT ANGIOGRAPHY CHEST
CT ABDOMEN AND PELVIS WITH CONTRAST
TECHNIQUE: Multidetector CT imaging of the chest was performed using the
standard protocol during bolus administration of intravenous
contrast. Multiplanar CT image reconstructions and MIPs were
obtained to evaluate the vascular anatomy. Multidetector CT imaging
of the abdomen and pelvis was performed using the standard protocol
during bolus administration of intravenous contrast.
CONTRAST:  100 mL Isovue 370

[Series 5: axial st · axial · 0.98mm/px · z∈[+1325,+1448]mm · 3 of 103 slices shown (1 of 2)]
[im 21/103  lung]
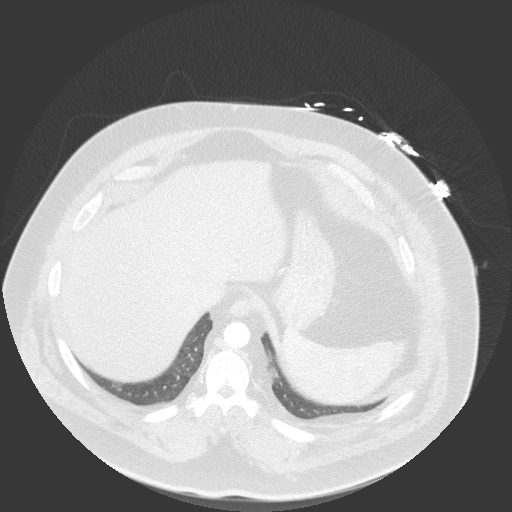
[im 41/103  lung]
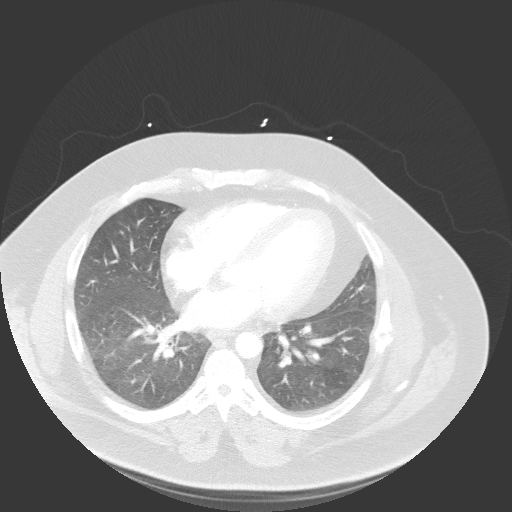
[im 62/103  lung]
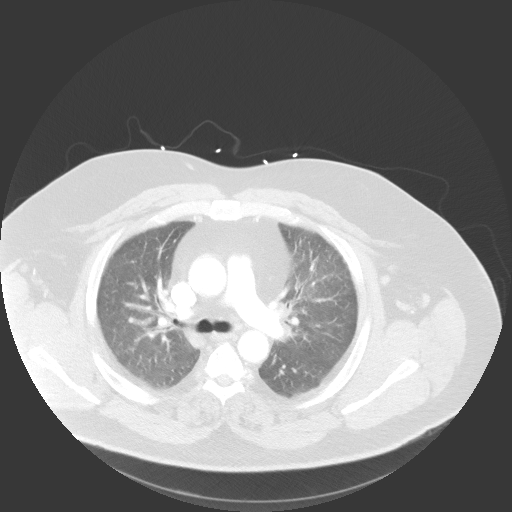

[Series 6: thins · axial · 0.98mm/px · z∈[+1300,+1552]mm · 8 of 308 slices shown]
[im 37/308  lung]
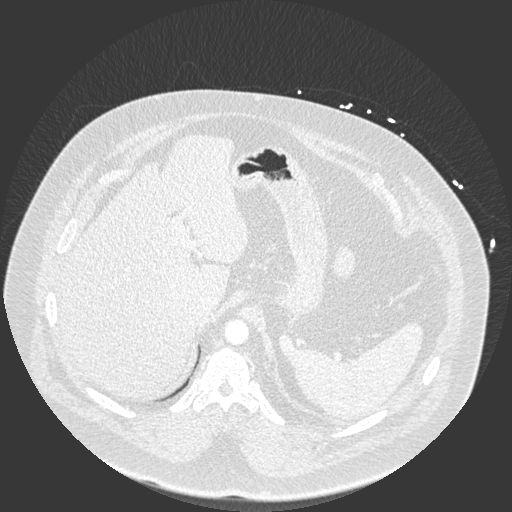
[im 73/308  lung]
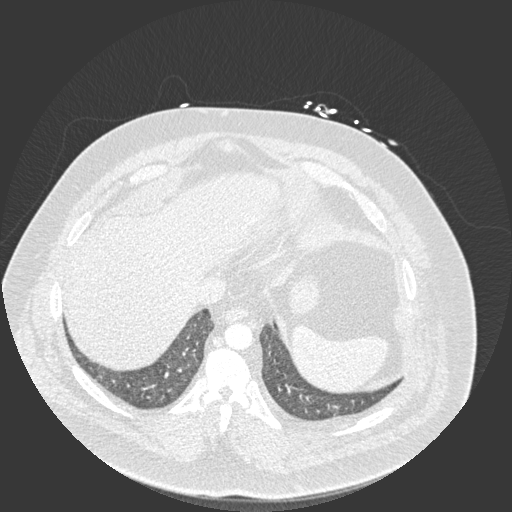
[im 109/308  lung]
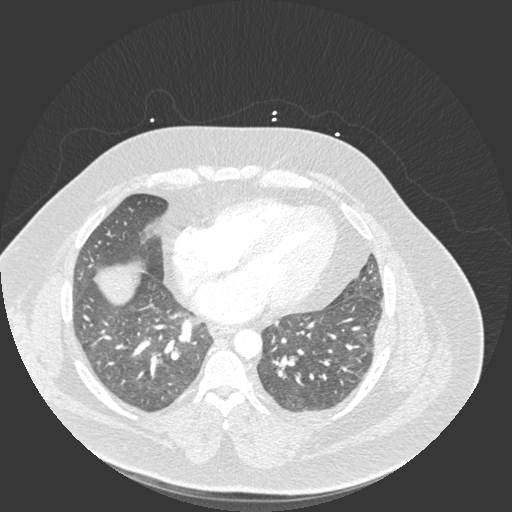
[im 145/308  lung]
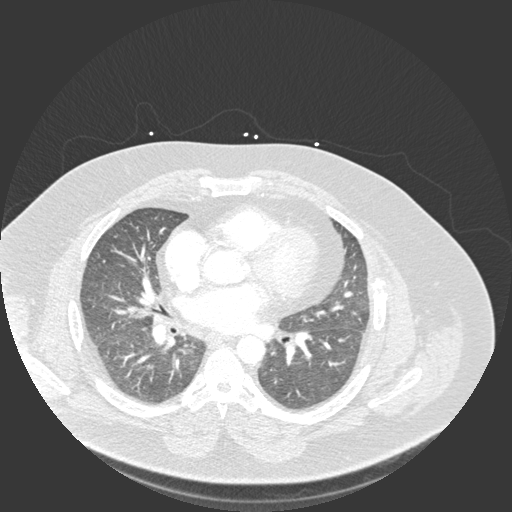
[im 181/308  lung]
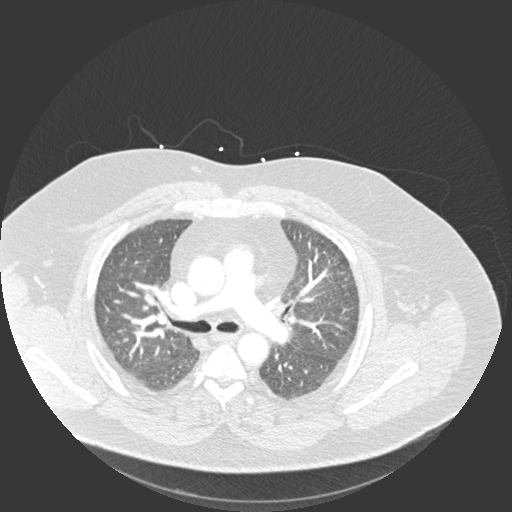
[im 217/308  lung]
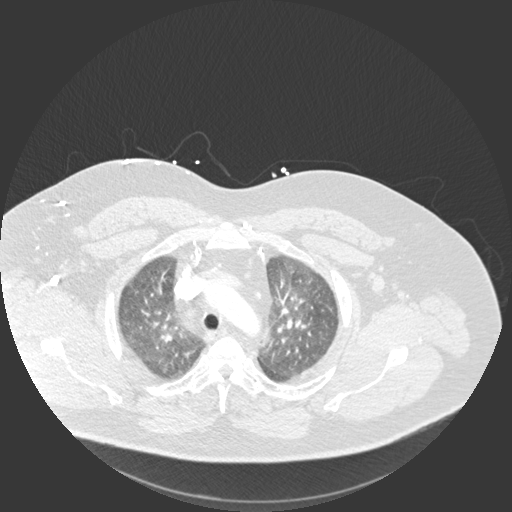
[im 253/308  lung]
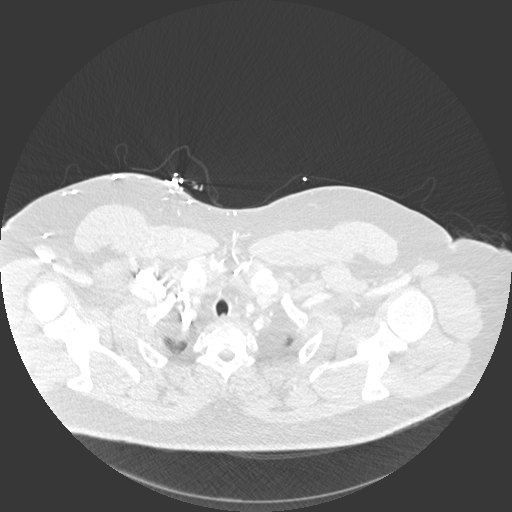
[im 289/308  lung]
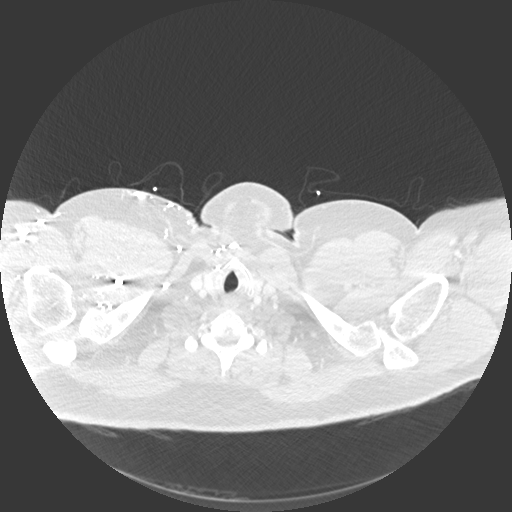

[Series 8: coronal mpr · coronal · 0.65mm/px · 1 of 214 slices shown]
[im 107/214  soft-tissue]
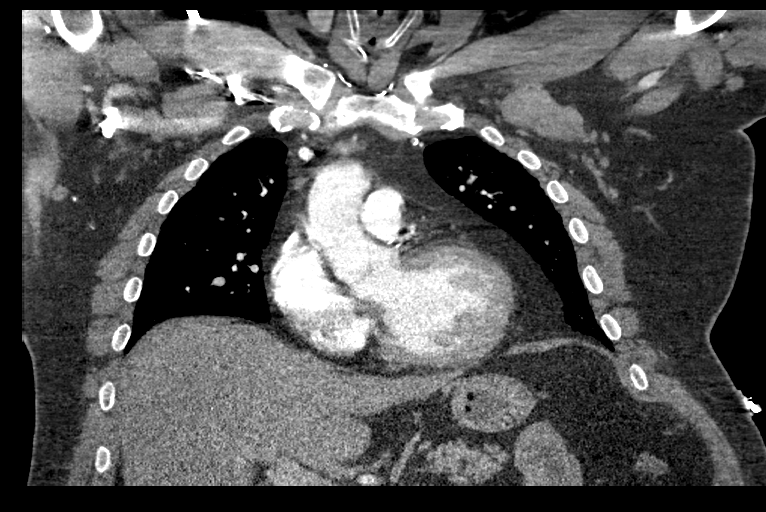

[Series 13: axial st · axial · 0.98mm/px · z∈[+1000,+1295]mm · 4 of 99 slices shown (2 of 2)]
[im 20/99  lung]
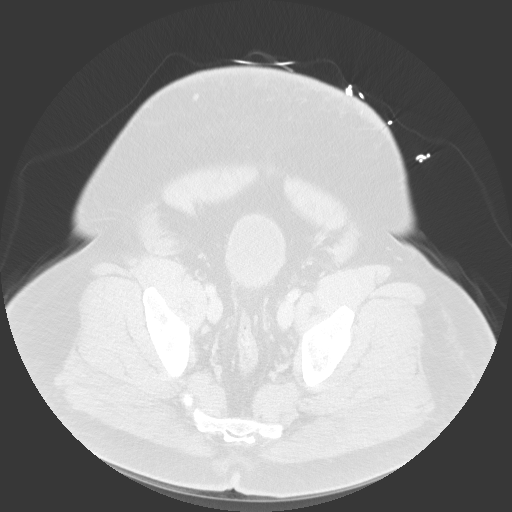
[im 40/99  soft-tissue]
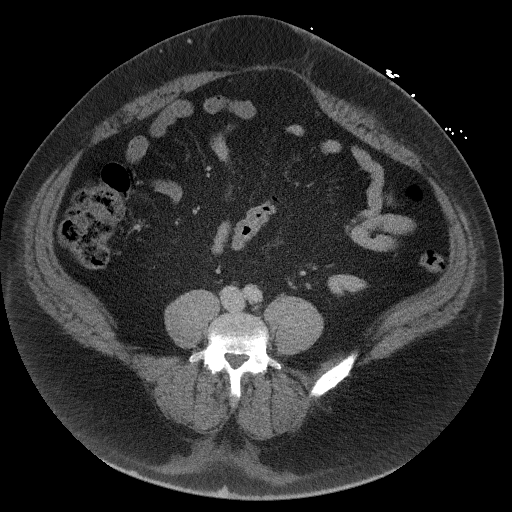
[im 59/99  lung]
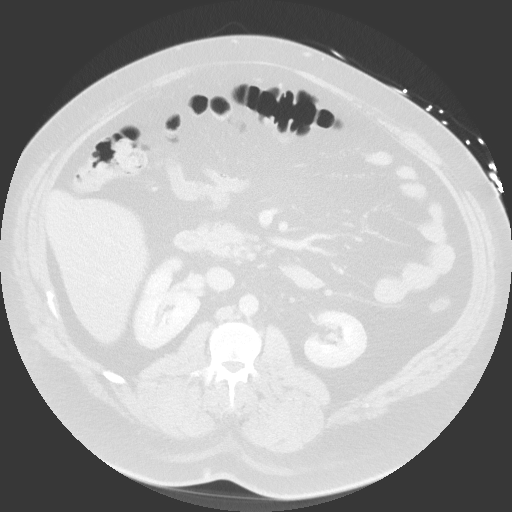
[im 79/99  soft-tissue]
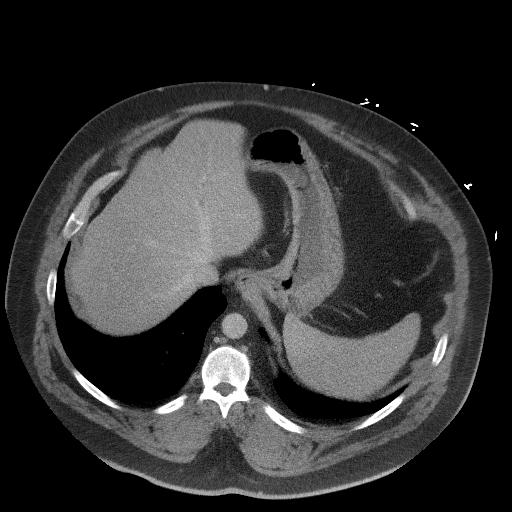

[16 of 46 positions shown; findings below may reference images not displayed]

FINDINGS: CTA CHEST FINDINGS

Cardiovascular: Thoracic aorta shows slight variant anatomy with
origin of the left subclavian artery from the aorta directly. No
significant atherosclerotic disease or aneurysmal dilatation is
identified. Heart is at the upper limits of normal in size. The
pulmonary artery shows a normal branching pattern. No definitive
filling defect to suggest pulmonary embolus is identified. Very
minimal coronary calcifications are noted.

Mediastinum/Nodes: The thoracic aorta is unremarkable. Mild
mediastinal lipomatosis is noted. Scattered small mediastinal lymph
nodes are noted although not significant by size criteria. No
significant hilar adenopathy is identified. The esophagus is within
normal limits.

Lungs/Pleura: The lungs are well aerated bilaterally and demonstrate
some patchy interstitial change. This may be related to a component
of contusion given the multiple rib fractures. No sizable effusion
is seen. No pneumothorax is noted.

Musculoskeletal: Mild degenerative changes of the thoracic spine are
noted. There are multiple healing fractures identified up on the
left involving the sixth through ninth ribs posterolaterally. Some
callus formation is identified.

Review of the MIP images confirms the above findings.

CT ABDOMEN and PELVIS FINDINGS

Hepatobiliary: No focal liver abnormality is seen. No gallstones,
gallbladder wall thickening, or biliary dilatation.

Pancreas: Unremarkable. No pancreatic ductal dilatation or
surrounding inflammatory changes.

Spleen: Normal in size without focal abnormality.

Adrenals/Urinary Tract: The adrenal glands are within normal limits.
The kidneys demonstrate a normal enhancement pattern. No obstructive
changes are seen. No renal calculi are noted. Delayed images
demonstrate normal excretion of contrast. The bladder is
predominately decompressed.

Stomach/Bowel: Stomach is within normal limits. Appendix appears
normal. No evidence of bowel wall thickening, distention, or
inflammatory changes.

Vascular/Lymphatic: Aortic atherosclerosis. No enlarged abdominal or
pelvic lymph nodes.

Reproductive: Prostate is unremarkable.

Other: There is a fat containing umbilical hernia identified. The
neck measures 2.7 cm in greatest transverse dimension. Just superior
to this there is a second periumbilical fat containing hernia. The
neck measures 4 cm in greatest dimension. The hernia itself measures
approximately 12.5 cm in greatest dimension.

Musculoskeletal: Mild degenerative changes of the lumbar spine are
noted. Adjacent to the pubic symphysis there are small areas of
fluid attenuation most prominent posteriorly likely related to some
synovial changes. No changes of osteomyelitis are noted.

Review of the MIP images confirms the above findings.
IMPRESSION: CTA of the chest: No evidence of pulmonary emboli.

Multiple left rib fractures with callus formation. No pneumothorax
is identified.

Mild patchy changes in the lungs particularly on the left which may
be related to a degree of contusion. No focal confluent infiltrate
is seen.

CT of the abdomen and pelvis: Fat containing umbilical and
periumbilical hernias.

Degenerative changes of the pubic symphysis with some synovial
reaction.

## 2019-09-18 IMAGING — DX DG CHEST 2V
2 series · 2 of 2 positions shown · non-contrast
Comparison: 03/17/2017

CLINICAL DATA: Left side rib pain below axilla. Known rib
fractures.

EXAM:
CHEST  2 VIEW

[chest pa]
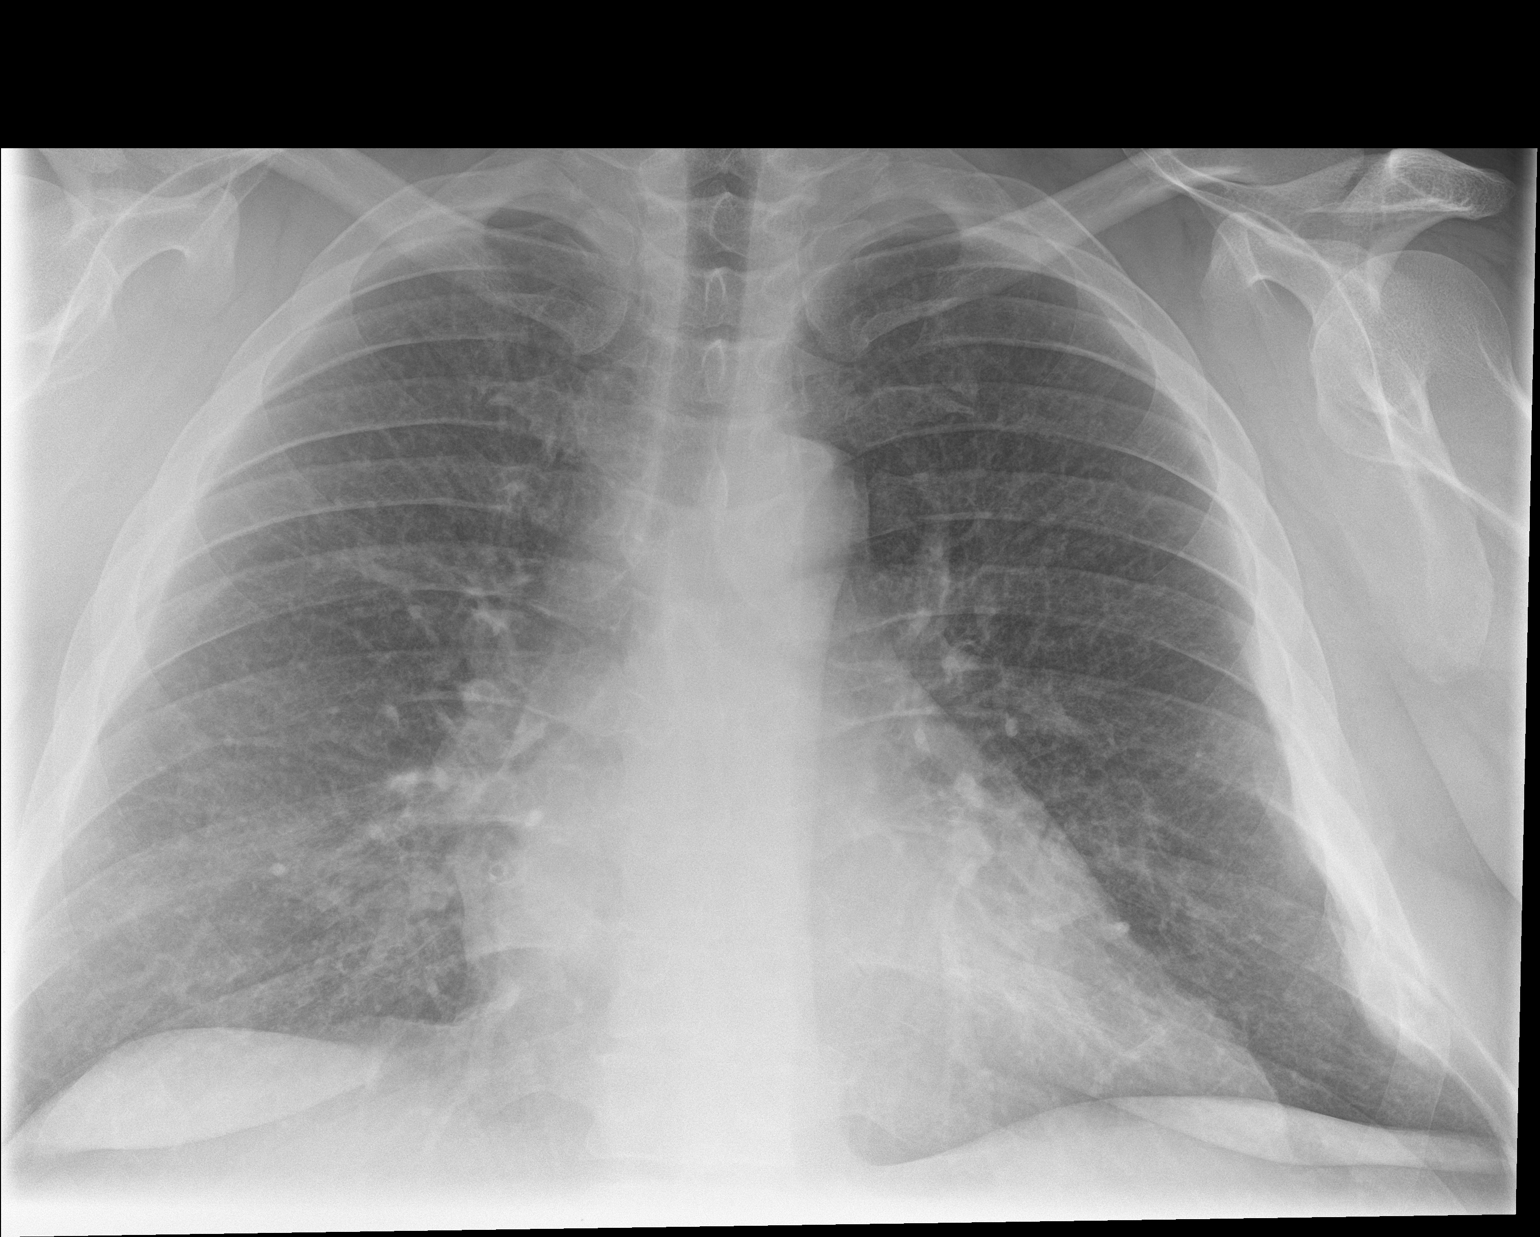

[chest lat]
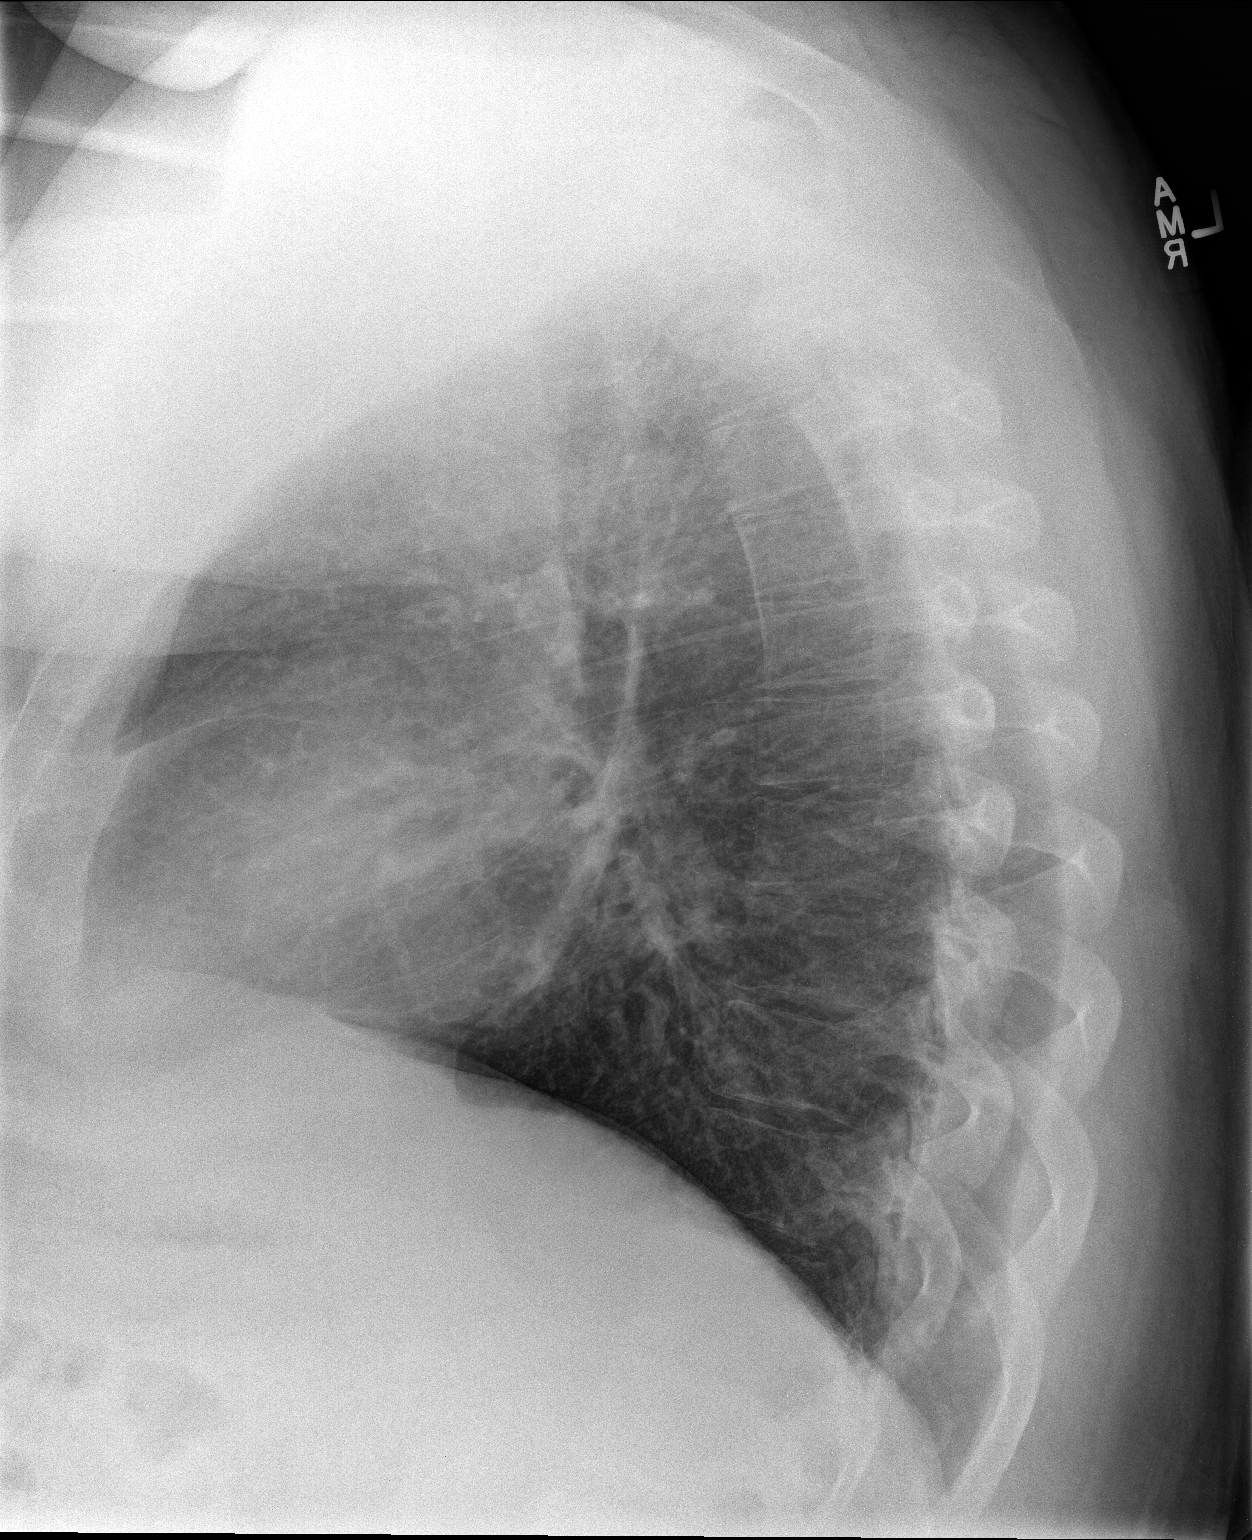

[2 of 2 positions shown; findings below may reference images not displayed]

FINDINGS: Healing left lateral rib fractures are noted. No effusion or
pneumothorax. Heart is mildly enlarged. No confluent opacities or
overt edema. Peribronchial thickening and mild interstitial
prominence, likely bronchitis.
IMPRESSION: Healing left lateral rib fractures.

Mild cardiomegaly and bronchitic changes.

## 2019-10-07 ENCOUNTER — Ambulatory Visit: Payer: Self-pay | Admitting: Physician Assistant

## 2019-10-15 ENCOUNTER — Ambulatory Visit: Payer: Self-pay | Admitting: Physician Assistant

## 2019-10-15 ENCOUNTER — Encounter: Payer: Self-pay | Admitting: Physician Assistant

## 2019-10-15 VITALS — BP 125/75

## 2019-10-15 DIAGNOSIS — I10 Essential (primary) hypertension: Secondary | ICD-10-CM

## 2019-10-15 DIAGNOSIS — F4321 Adjustment disorder with depressed mood: Secondary | ICD-10-CM

## 2019-10-15 DIAGNOSIS — E118 Type 2 diabetes mellitus with unspecified complications: Secondary | ICD-10-CM

## 2019-10-15 DIAGNOSIS — E785 Hyperlipidemia, unspecified: Secondary | ICD-10-CM

## 2019-10-15 DIAGNOSIS — F17200 Nicotine dependence, unspecified, uncomplicated: Secondary | ICD-10-CM

## 2019-10-15 NOTE — Progress Notes (Signed)
BP 125/75    Subjective:    Patient ID: Thomas Myers, male    DOB: 1971/04/08, 49 y.o.   MRN: [id number]  HPI: Thomas Myers is a 49 y.o. male presenting on 10/15/2019 for No chief complaint on file.   HPI   This is a telemedicine appointment due to coronavirus pandemic.  It is via Telephone as there was some technical difficulties trying to connect through [employer] today.  I connected with  [name] on 10/15/19 by a video enabled telemedicine application and verified that I am speaking with the correct person using two identifiers.   I discussed the limitations of evaluation and management by telemedicine. The patient expressed understanding and agreed to proceed.  Pt is at home.  Provider is at office.       Pt is a 67yoM with DM, htn, dyslipidemia who has appointment today for routine follow-up  He says his mood is improved.  He has good days and bad days.  Pt didn't get labs drawn yet.  He monitors his BP at home.   Pt had both covid vaccination shots  He has no new complaints today.      Relevant past medical, surgical, family and social history reviewed and updated as indicated. Interim medical history since our last visit reviewed. Allergies and medications reviewed and updated.    Current Outpatient Medications:  .  atenolol (TENORMIN) 50 MG tablet, Take 1 tablet by mouth once daily, Disp: 90 tablet, Rfl: 0 .  lisinopril (ZESTRIL) 20 MG tablet, Take 1 tablet by mouth once daily, Disp: 90 tablet, Rfl: 1 .  MELATONIN PO, Take by mouth., Disp: , Rfl:  .  metFORMIN (GLUCOPHAGE) 500 MG tablet, Take 1 tablet (500 mg total) by mouth 2 (two) times daily with a meal., Disp: 60 tablet, Rfl: 4 .  simvastatin (ZOCOR) 20 MG tablet, TAKE 1 TABLET BY MOUTH AT BEDTIME, Disp: 90 tablet, Rfl: 1 .  cetirizine (ZYRTEC) 10 MG tablet, Take 10 mg by mouth daily., Disp: , Rfl:  .  gabapentin (NEURONTIN) 100 MG capsule, Take 1 capsule by mouth twice daily (Patient not  taking: Reported on 10/15/2019), Disp: 60 capsule, Rfl: 1    Review of Systems  Per HPI unless specifically indicated above     Objective:    BP 125/75   Wt Readings from Last 3 Encounters:  01/28/19 (!) 343 lb (155.6 kg)  01/01/19 (!) 348 lb (157.9 kg)  10/09/18 (!) 345 lb (156.5 kg)    Physical Exam Pulmonary:     Effort: No respiratory distress.  Neurological:     Mental Status: He is alert and oriented to person, place, and time.  Psychiatric:        Attention and Perception: Attention normal.        Speech: Speech normal.        Behavior: Behavior is cooperative.     Comments: Pt is his usual pleasant, chatty self           Assessment & Plan:    Encounter Diagnoses  Name Primary?  . Controlled diabetes mellitus type 2 with complications, unspecified whether long term insulin use ([place]) Yes  . Essential hypertension   . Hyperlipidemia, unspecified hyperlipidemia type   . Tobacco use disorder   . Morbid obesity ([place])   . Grief      -pt to Get labs drawn.  He will be called with results -pt to continue current medications -encouraged pt to continue  exercising/walking -pt to follow up 3 months.  He is to contact office sooner prn

## 2020-01-06 ENCOUNTER — Encounter: Payer: Self-pay | Admitting: Physician Assistant

## 2020-01-20 ENCOUNTER — Ambulatory Visit: Payer: Self-pay | Admitting: Physician Assistant

## 2021-12-27 DEATH — deceased
